# Patient Record
Sex: Female | Born: 1990 | Race: White | Hispanic: No | Marital: Single | Smoking: Current every day smoker
Health system: Southern US, Community
[De-identification: ages and names within clinical notes are randomized; demographics above are authoritative.]

## PROBLEM LIST (undated history)

## (undated) DIAGNOSIS — F419 Anxiety disorder, unspecified: Secondary | ICD-10-CM

## (undated) DIAGNOSIS — F32A Depression, unspecified: Secondary | ICD-10-CM

## (undated) DIAGNOSIS — F329 Major depressive disorder, single episode, unspecified: Secondary | ICD-10-CM

## (undated) HISTORY — PX: ANTERIOR CRUCIATE LIGAMENT REPAIR: SHX115

---
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
## 2009-05-09 ENCOUNTER — Ambulatory Visit: Payer: Self-pay | Admitting: Family Medicine

## 2009-05-09 ENCOUNTER — Inpatient Hospital Stay (HOSPITAL_COMMUNITY): Admission: RE | Admit: 2009-05-09 | Discharge: 2009-05-12 | Payer: Self-pay | Admitting: Obstetrics and Gynecology

## 2010-10-09 LAB — CBC
Hemoglobin: 13.3 g/dL (ref 12.0–15.0)
MCHC: 34 g/dL (ref 30.0–36.0)
MCHC: 34.4 g/dL (ref 30.0–36.0)
MCV: 94.8 fL (ref 78.0–100.0)
Platelets: 239 10*3/uL (ref 150–400)
Platelets: 255 10*3/uL (ref 150–400)
RBC: 4.01 MIL/uL (ref 3.87–5.11)
RBC: 4.13 MIL/uL (ref 3.87–5.11)
RDW: 13.1 % (ref 11.5–15.5)
WBC: 19.1 10*3/uL — ABNORMAL HIGH (ref 4.0–10.5)

## 2010-10-09 LAB — RPR: RPR Ser Ql: NONREACTIVE

## 2011-05-19 ENCOUNTER — Other Ambulatory Visit (HOSPITAL_COMMUNITY)
Admission: RE | Admit: 2011-05-19 | Discharge: 2011-05-19 | Disposition: A | Discharge status: Home / Self Care | Payer: Medicaid Other | Source: Ambulatory Visit | Attending: Obstetrics and Gynecology | Admitting: Obstetrics and Gynecology

## 2011-05-19 DIAGNOSIS — Z01419 Encounter for gynecological examination (general) (routine) without abnormal findings: Secondary | ICD-10-CM | POA: Insufficient documentation

## 2011-05-19 DIAGNOSIS — Z113 Encounter for screening for infections with a predominantly sexual mode of transmission: Secondary | ICD-10-CM | POA: Insufficient documentation

## 2011-05-19 LAB — OB RESULTS CONSOLE RPR: RPR: NONREACTIVE

## 2011-05-19 LAB — OB RESULTS CONSOLE GC/CHLAMYDIA: Chlamydia: POSITIVE

## 2011-05-19 LAB — OB RESULTS CONSOLE HEPATITIS B SURFACE ANTIGEN: Hepatitis B Surface Ag: NEGATIVE

## 2011-05-19 LAB — OB RESULTS CONSOLE HIV ANTIBODY (ROUTINE TESTING): HIV: NONREACTIVE

## 2011-05-19 LAB — OB RESULTS CONSOLE ABO/RH: RH Type: POSITIVE

## 2011-07-08 NOTE — L&D Delivery Note (Signed)
Delivery Note At 3:07 AM a viable and healthy female was delivered via Vaginal, Spontaneous Delivery (Presentation: ; Occiput Anterior).  APGAR: , ; weight .   Placenta status: Intact, Spontaneous.  Cord: 3 vessels with the following complications: None.  Cord pH: n/a  Anesthesia: Epidural  Episiotomy:  Lacerations:  Suture Repair: n/a Est. Blood Loss (mL): 300  Mom to postpartum.  Baby to nursery-stable.  [facility] 11/09/2011, 3:19 AM

## 2011-10-26 ENCOUNTER — Encounter (HOSPITAL_COMMUNITY): Payer: Self-pay | Admitting: *Deleted

## 2011-10-26 ENCOUNTER — Inpatient Hospital Stay (HOSPITAL_COMMUNITY)
Admission: AD | Admit: 2011-10-26 | Discharge: 2011-10-26 | Disposition: A | Discharge status: Home / Self Care | Payer: Medicaid Other | Source: Ambulatory Visit | Attending: Obstetrics & Gynecology | Admitting: Obstetrics & Gynecology

## 2011-10-26 DIAGNOSIS — O99891 Other specified diseases and conditions complicating pregnancy: Secondary | ICD-10-CM | POA: Insufficient documentation

## 2011-10-26 DIAGNOSIS — O479 False labor, unspecified: Secondary | ICD-10-CM

## 2011-10-26 DIAGNOSIS — O471 False labor at or after 37 completed weeks of gestation: Secondary | ICD-10-CM

## 2011-10-26 LAB — WET PREP, GENITAL
Trich, Wet Prep: NONE SEEN
Yeast Wet Prep HPF POC: NONE SEEN

## 2011-10-26 LAB — AMNISURE RUPTURE OF MEMBRANE (ROM) NOT AT ARMC: Amnisure ROM: NEGATIVE

## 2011-10-26 NOTE — MAU Note (Signed)
Pt reports leaking clear fluid since 4pm and having ct 7-10 min apart. Reports fetal movement less than usual. Denies vaginal bleeding

## 2011-10-26 NOTE — MAU Provider Note (Signed)
  History     CSN: [id number]  Arrival date and time: 10/26/11 1847   None     Chief Complaint  Patient presents with  . Rupture of Membranes   HPI G2P1001 @ 37 4 in with c/o leaking fluid and contractions.  OB History    Grav Para Term Preterm Abortions TAB SAB Ect Mult Living   2 1 1       1       Past Medical History  Diagnosis Date  . No pertinent past medical history     Past Surgical History  Procedure Date  . Anterior cruciate ligament repair     History reviewed. No pertinent family history.  History  Substance Use Topics  . Smoking status: Current Everyday Smoker -- 0.5 packs/day    Types: Cigarettes  . Smokeless tobacco: Not on file  . Alcohol Use: No    Allergies: Allergies not on file  No prescriptions prior to admission    Review of Systems  Constitutional: Negative.   HENT: Negative.   Eyes: Negative.   Respiratory: Negative.   Cardiovascular: Negative.   Gastrointestinal: Positive for abdominal pain.  Genitourinary: Negative.   Musculoskeletal: Negative.   Skin: Negative.   Neurological: Negative.   Endo/Heme/Allergies: Negative.   Psychiatric/Behavioral: Negative.    Physical Exam   Blood pressure 113/70, pulse 105, temperature 97.9 F (36.6 C), temperature source Oral, resp. rate 18, height 5\' 4"  (1.626 m), weight 135 lb (61.236 kg).  Physical Exam  Constitutional: She is oriented to person, place, and time. She appears well-developed.  HENT:  Head: Normocephalic.  Neck: Normal range of motion.  Cardiovascular: Normal rate, regular rhythm, normal heart sounds and intact distal pulses.   Respiratory: Effort normal and breath sounds normal.  GI: Soft. Bowel sounds are normal.  Genitourinary: Vagina normal and uterus normal.  Musculoskeletal: Normal range of motion.  Neurological: She is alert and oriented to person, place, and time. She has normal reflexes.  Skin: Skin is warm and dry.  Psychiatric: She has a normal mood and  affect. Her behavior is normal. Judgment and thought content normal.    MAU Course  Procedures  MDM   Assessment and Plan  Sterile spec exam, no active leaking on exam or with strong cough, frothy green discharge noted wet prep obtained. If neg amnisure will d/c home, only having mild irreg contractions.  Pamela Glover 10/26/2011, 7:34 PM

## 2011-10-26 NOTE — Discharge Instructions (Signed)

## 2011-11-08 ENCOUNTER — Encounter (HOSPITAL_COMMUNITY): Payer: Self-pay | Admitting: *Deleted

## 2011-11-08 ENCOUNTER — Inpatient Hospital Stay (HOSPITAL_COMMUNITY)
Admission: AD | Admit: 2011-11-08 | Discharge: 2011-11-10 | DRG: 775 | Disposition: A | Discharge status: Home / Self Care | Payer: Medicaid Other | Source: Ambulatory Visit | Attending: Family Medicine | Admitting: Family Medicine

## 2011-11-08 DIAGNOSIS — IMO0001 Reserved for inherently not codable concepts without codable children: Secondary | ICD-10-CM

## 2011-11-08 HISTORY — DX: Depression, unspecified: F32.A

## 2011-11-08 HISTORY — DX: Major depressive disorder, single episode, unspecified: F32.9

## 2011-11-08 LAB — CBC
HCT: 40.1 % (ref 36.0–46.0)
Hemoglobin: 13.8 g/dL (ref 12.0–15.0)
MCH: 31.6 pg (ref 26.0–34.0)
MCHC: 34.4 g/dL (ref 30.0–36.0)
MCV: 91.8 fL (ref 78.0–100.0)
RBC: 4.37 MIL/uL (ref 3.87–5.11)

## 2011-11-08 MED ORDER — LACTATED RINGERS IV SOLN
500.0000 mL | Freq: Once | INTRAVENOUS | Status: AC
  Administered 2011-11-08: 500 mL via INTRAVENOUS

## 2011-11-08 MED ORDER — ONDANSETRON HCL 4 MG/2ML IJ SOLN: 4.0000 mg | Freq: Four times a day (QID) | INTRAMUSCULAR | Status: DC | PRN

## 2011-11-08 MED ORDER — CITRIC ACID-SODIUM CITRATE 334-500 MG/5ML PO SOLN: 30.0000 mL | ORAL | Status: DC | PRN

## 2011-11-08 MED ORDER — PHENYLEPHRINE 40 MCG/ML (10ML) SYRINGE FOR IV PUSH (FOR BLOOD PRESSURE SUPPORT)
80.0000 ug | PREFILLED_SYRINGE | INTRAVENOUS | Status: DC | PRN
  Filled 2011-11-08: qty 5

## 2011-11-08 MED ORDER — IBUPROFEN 600 MG PO TABS
600.0000 mg | ORAL_TABLET | Freq: Four times a day (QID) | ORAL | Status: DC | PRN
  Administered 2011-11-09: 600 mg via ORAL
  Filled 2011-11-08: qty 1

## 2011-11-08 MED ORDER — FLEET ENEMA 7-19 GM/118ML RE ENEM: 1.0000 | ENEMA | RECTAL | Status: DC | PRN

## 2011-11-08 MED ORDER — PHENYLEPHRINE 40 MCG/ML (10ML) SYRINGE FOR IV PUSH (FOR BLOOD PRESSURE SUPPORT): 80.0000 ug | PREFILLED_SYRINGE | INTRAVENOUS | Status: DC | PRN

## 2011-11-08 MED ORDER — LACTATED RINGERS IV SOLN: 500.0000 mL | INTRAVENOUS | Status: DC | PRN

## 2011-11-08 MED ORDER — EPHEDRINE 5 MG/ML INJ
10.0000 mg | INTRAVENOUS | Status: DC | PRN
  Filled 2011-11-08: qty 4

## 2011-11-08 MED ORDER — DIPHENHYDRAMINE HCL 50 MG/ML IJ SOLN: 12.5000 mg | INTRAMUSCULAR | Status: DC | PRN

## 2011-11-08 MED ORDER — FENTANYL 2.5 MCG/ML BUPIVACAINE 1/10 % EPIDURAL INFUSION (WH - ANES)
14.0000 mL/h | INTRAMUSCULAR | Status: DC
  Filled 2011-11-08: qty 60

## 2011-11-08 MED ORDER — EPHEDRINE 5 MG/ML INJ: 10.0000 mg | INTRAVENOUS | Status: DC | PRN

## 2011-11-08 MED ORDER — OXYCODONE-ACETAMINOPHEN 5-325 MG PO TABS: 1.0000 | ORAL_TABLET | ORAL | Status: DC | PRN

## 2011-11-08 MED ORDER — LIDOCAINE HCL (PF) 1 % IJ SOLN
30.0000 mL | INTRAMUSCULAR | Status: DC | PRN
Start: 2011-11-08 — End: 2011-11-09
  Filled 2011-11-08: qty 30

## 2011-11-08 MED ORDER — OXYTOCIN 20 UNITS IN LACTATED RINGERS INFUSION - SIMPLE
125.0000 mL/h | Freq: Once | INTRAVENOUS | Status: AC
  Administered 2011-11-09: 500 mL/h via INTRAVENOUS

## 2011-11-08 MED ORDER — OXYTOCIN BOLUS FROM INFUSION
500.0000 mL | Freq: Once | INTRAVENOUS | Status: DC
  Filled 2011-11-08: qty 1000
  Filled 2011-11-08: qty 500

## 2011-11-08 MED ORDER — ACETAMINOPHEN 325 MG PO TABS: 650.0000 mg | ORAL_TABLET | ORAL | Status: DC | PRN

## 2011-11-08 MED ORDER — LACTATED RINGERS IV SOLN
INTRAVENOUS | Status: DC
  Administered 2011-11-08: 23:00:00 via INTRAVENOUS

## 2011-11-08 NOTE — H&P (Signed)
Pamela Glover is a 21 y.o. female presenting for contractions. Maternal Medical History:  Reason for admission: Reason for admission: contractions.  Contractions: Onset was 1-2 hours ago.   Frequency: regular.   Perceived severity is strong.    Fetal activity: Perceived fetal activity is normal.   Last perceived fetal movement was within the past hour.    Prenatal complications: No bleeding.   Polyhydramnios:  contractions.       OB History    Grav Para Term Preterm Abortions TAB SAB Ect Mult Living   2 1 1       1      Past Medical History  Diagnosis Date  . Depression    Past Surgical History  Procedure Date  . Anterior cruciate ligament repair    Family History: family history is not on file. Social History:  reports that she has been smoking Cigarettes.  She has been smoking about .5 packs per day. She does not have any smokeless tobacco history on file. She reports that she does not drink alcohol or use illicit drugs.  Review of Systems  Gastrointestinal: Positive for abdominal pain.  All other systems reviewed and are negative.    Dilation: 5 Effacement (%): 90 Station: -1 Exam by:: Peace, rn Height 5\' 4"  (1.626 m), weight 61.349 kg (135 lb 4 oz). Maternal Exam:  Uterine Assessment: Contraction strength is firm.  Abdomen: Estimated fetal weight is 5-5.5.   Fetal presentation: vertex  Introitus: Vagina is positive for vaginal discharge (mucusy).    Fetal Exam Fetal Monitor Review: Baseline rate: 130's.  Variability: moderate (6-25 bpm).   Pattern: accelerations present.    Fetal State Assessment: Category I - tracings are normal.     Physical Exam  Constitutional: She is oriented to person, place, and time. She appears well-developed and well-nourished.  HENT:  Head: Normocephalic.  Neck: Normal range of motion. Neck supple.  Cardiovascular: Normal rate, regular rhythm and normal heart sounds.   Respiratory: Effort normal and breath sounds normal.    GI: Soft. There is no tenderness.  Genitourinary: No bleeding around the vagina. Vaginal discharge (mucusy) found.  Musculoskeletal: Normal range of motion.  Neurological: She is alert and oriented to person, place, and time.  Skin: Skin is warm and dry.    Prenatal labs: ABO, Rh: O/Positive/-- (11/12 0000) Antibody: Negative (11/12 0000) Rubella: Immune (11/12 0000) RPR: Nonreactive (11/12 0000)  HBsAg: Negative (11/12 0000)  HIV: Non-reactive (11/12 0000)  GBS: Negative (04/16 0000)   Assessment/Plan: Active Labor GBS neg  Plan: Admit to Birthing Suites May have epidural Anticipate NSVD   [facility] 11/08/2011, 11:48 PM

## 2011-11-09 ENCOUNTER — Encounter (HOSPITAL_COMMUNITY): Payer: Self-pay | Admitting: Anesthesiology

## 2011-11-09 ENCOUNTER — Encounter (HOSPITAL_COMMUNITY): Payer: Self-pay | Admitting: *Deleted

## 2011-11-09 ENCOUNTER — Inpatient Hospital Stay (HOSPITAL_COMMUNITY): Payer: Medicaid Other | Admitting: Anesthesiology

## 2011-11-09 LAB — ABO/RH: ABO/RH(D): O POS

## 2011-11-09 MED ORDER — ONDANSETRON HCL 4 MG/2ML IJ SOLN: 4.0000 mg | INTRAMUSCULAR | Status: DC | PRN

## 2011-11-09 MED ORDER — ZOLPIDEM TARTRATE 5 MG PO TABS: 5.0000 mg | ORAL_TABLET | Freq: Every evening | ORAL | Status: DC | PRN

## 2011-11-09 MED ORDER — SENNOSIDES-DOCUSATE SODIUM 8.6-50 MG PO TABS
2.0000 | ORAL_TABLET | Freq: Every day | ORAL | Status: DC
  Administered 2011-11-09: 2 via ORAL

## 2011-11-09 MED ORDER — PRENATAL MULTIVITAMIN CH
1.0000 | ORAL_TABLET | Freq: Every day | ORAL | Status: DC
  Administered 2011-11-09 – 2011-11-10 (×2): 1 via ORAL
  Filled 2011-11-09 (×2): qty 1

## 2011-11-09 MED ORDER — LIDOCAINE HCL (PF) 1 % IJ SOLN
INTRAMUSCULAR | Status: DC | PRN
  Administered 2011-11-09 (×2): 8 mL

## 2011-11-09 MED ORDER — WITCH HAZEL-GLYCERIN EX PADS: 1.0000 "application " | MEDICATED_PAD | CUTANEOUS | Status: DC | PRN

## 2011-11-09 MED ORDER — BENZOCAINE-MENTHOL 20-0.5 % EX AERO
1.0000 "application " | INHALATION_SPRAY | CUTANEOUS | Status: DC | PRN
  Filled 2011-11-09: qty 56

## 2011-11-09 MED ORDER — FENTANYL 2.5 MCG/ML BUPIVACAINE 1/10 % EPIDURAL INFUSION (WH - ANES)
INTRAMUSCULAR | Status: DC | PRN
  Administered 2011-11-09: 14 mL/h via EPIDURAL

## 2011-11-09 MED ORDER — DIBUCAINE 1 % RE OINT: 1.0000 "application " | TOPICAL_OINTMENT | RECTAL | Status: DC | PRN

## 2011-11-09 MED ORDER — SIMETHICONE 80 MG PO CHEW: 80.0000 mg | CHEWABLE_TABLET | ORAL | Status: DC | PRN

## 2011-11-09 MED ORDER — OXYCODONE-ACETAMINOPHEN 5-325 MG PO TABS
1.0000 | ORAL_TABLET | ORAL | Status: DC | PRN
  Administered 2011-11-09: 2 via ORAL
  Administered 2011-11-09 (×2): 1 via ORAL
  Administered 2011-11-09 – 2011-11-10 (×3): 2 via ORAL
  Filled 2011-11-09 (×3): qty 2
  Filled 2011-11-09: qty 1
  Filled 2011-11-09: qty 2
  Filled 2011-11-09: qty 1

## 2011-11-09 MED ORDER — DIPHENHYDRAMINE HCL 25 MG PO CAPS: 25.0000 mg | ORAL_CAPSULE | Freq: Four times a day (QID) | ORAL | Status: DC | PRN

## 2011-11-09 MED ORDER — ONDANSETRON HCL 4 MG PO TABS: 4.0000 mg | ORAL_TABLET | ORAL | Status: DC | PRN

## 2011-11-09 MED ORDER — LANOLIN HYDROUS EX OINT: TOPICAL_OINTMENT | CUTANEOUS | Status: DC | PRN

## 2011-11-09 MED ORDER — IBUPROFEN 600 MG PO TABS
600.0000 mg | ORAL_TABLET | Freq: Four times a day (QID) | ORAL | Status: DC
  Administered 2011-11-09 – 2011-11-10 (×5): 600 mg via ORAL
  Filled 2011-11-09 (×5): qty 1

## 2011-11-09 MED ORDER — TETANUS-DIPHTH-ACELL PERTUSSIS 5-2.5-18.5 LF-MCG/0.5 IM SUSP
0.5000 mL | Freq: Once | INTRAMUSCULAR | Status: AC
  Administered 2011-11-10: 0.5 mL via INTRAMUSCULAR
  Filled 2011-11-09: qty 0.5

## 2011-11-09 NOTE — Anesthesia Procedure Notes (Signed)
Epidural Patient location during procedure: OB Start time: 11/09/2011 12:13 AM End time: 11/09/2011 12:17 AM Reason for block: procedure for pain  Staffing Anesthesiologist: Sandrea Hughs  Preanesthetic Checklist Completed: patient identified, site marked, surgical consent, pre-op evaluation, timeout performed, IV checked, risks and benefits discussed and monitors and equipment checked  Epidural Patient position: sitting Prep: site prepped and draped and DuraPrep Patient monitoring: continuous pulse ox and blood pressure Approach: midline Injection technique: LOR air  Needle:  Needle type: Tuohy  Needle gauge: 17 G Needle length: 9 cm Catheter type: closed end flexible Catheter size: 19 Gauge Catheter at skin depth: 8 cm Test dose: negative  Assessment Sensory level: T8 Events: blood not aspirated, injection not painful, no injection resistance, negative IV test and no paresthesia

## 2011-11-09 NOTE — Progress Notes (Addendum)
Patient and significant other given fall prevention instructions and to call so stedy could be used to assist patient to bathroom. Fall prevention instructions also signed by patient. Patient had significant other assist her to bathroom without calling. Patient stated "I was able to walk in bathroom without a problem and I did not fall".  Patient again instructed to call for assist.

## 2011-11-09 NOTE — Progress Notes (Signed)
Pt shaking, RN reassessing 

## 2011-11-09 NOTE — H&P (Signed)
Chart reviewed and agree with management and plan.  

## 2011-11-09 NOTE — Progress Notes (Signed)
   Subjective: Pt reports comfortable after epidural.    Objective: BP 98/67  Pulse 102  Temp(Src) 97.8 F (36.6 C) (Oral)  Resp 18  Ht 5\' 4"  (1.626 m)  Wt 61.349 kg (135 lb 4 oz)  BMI 23.22 kg/m2  SpO2 99%      FHT:  FHR: 140's bpm, variability: moderate,  accelerations:  Present,  decelerations:  Absent UC:   regular, every 2-3 minutes SVE:   Dilation: 6 Effacement (%): 80 Station: -1 Exam by:: Roney Marion, CNM  AROM>clear  Labs: Lab Results  Component Value Date   WBC 18.9* 11/08/2011   HGB 13.8 11/08/2011   HCT 40.1 11/08/2011   MCV 91.8 11/08/2011   PLT 229 11/08/2011    Assessment / Plan: Spontaneous labor, progressing normally  Labor: Progressing normally Preeclampsia:  n/a Fetal Wellbeing:  Category I Pain Control:  Epidural I/D:  n/a Anticipated MOD:  NSVD  Pamela Glover,Pamela Glover 11/09/2011, 1:58 AM

## 2011-11-09 NOTE — Anesthesia Preprocedure Evaluation (Signed)
Anesthesia Evaluation  Patient identified by MRN, date of birth, ID band Patient awake    Reviewed: Allergy & Precautions, H&P , NPO status , Patient's Chart, lab work & pertinent test results  Airway Mallampati: I TM Distance: >3 FB Neck ROM: full    Dental No notable dental hx.    Pulmonary neg pulmonary ROS,  breath sounds clear to auscultation  Pulmonary exam normal       Cardiovascular negative cardio ROS      Neuro/Psych PSYCHIATRIC DISORDERS Depression negative neurological ROS     GI/Hepatic negative GI ROS, Neg liver ROS,   Endo/Other  negative endocrine ROS  Renal/GU negative Renal ROS  negative genitourinary   Musculoskeletal negative musculoskeletal ROS (+)   Abdominal Normal abdominal exam  (+)   Peds negative pediatric ROS (+)  Hematology negative hematology ROS (+)   Anesthesia Other Findings   Reproductive/Obstetrics (+) Pregnancy                           Anesthesia Physical Anesthesia Plan  ASA: II  Anesthesia Plan: Epidural   Post-op Pain Management:    Induction:   Airway Management Planned:   Additional Equipment:   Intra-op Plan:   Post-operative Plan:   Informed Consent: I have reviewed the patients History and Physical, chart, labs and discussed the procedure including the risks, benefits and alternatives for the proposed anesthesia with the patient or authorized representative who has indicated his/her understanding and acceptance.     Plan Discussed with:   Anesthesia Plan Comments:         Anesthesia Quick Evaluation

## 2011-11-10 MED ORDER — IBUPROFEN 600 MG PO TABS: 600.0000 mg | ORAL_TABLET | Freq: Four times a day (QID) | ORAL | Status: AC

## 2011-11-10 NOTE — Progress Notes (Signed)
UR chart review completed.  

## 2011-11-10 NOTE — Anesthesia Postprocedure Evaluation (Signed)
Anesthesia Post Note  Patient: Pamela Glover  Procedure(s) Performed: * No procedures listed *  Anesthesia type: Epidural  Patient location: Mother/Baby  Post pain: Pain level controlled  Post assessment: Post-op Vital signs reviewed  Last Vitals: There were no vitals filed for this visit.  Post vital signs: Reviewed  Level of consciousness: awake  Complications: No apparent anesthesia complications

## 2011-11-10 NOTE — Discharge Summary (Signed)
Obstetric Discharge Summary Reason for Admission: onset of labor Prenatal Procedures: none Intrapartum Procedures: spontaneous vaginal delivery Postpartum Procedures: none Complications-Operative and Postpartum: none Hemoglobin  Date Value Range Status  11/08/2011 13.8  12.0-15.0 (g/dL) Final     HCT  Date Value Range Status  11/08/2011 40.1  36.0-46.0 (%) Final    Physical Exam:  General: alert, cooperative and no distress Lochia: appropriate Uterine Fundus: firm DVT Evaluation: No evidence of DVT seen on physical exam. Negative Homan's sign.  Discharge Diagnoses: Term Pregnancy-delivered  Discharge Information: Date: 11/10/2011 Activity: pelvic rest Diet: routine Medications: PNV and Ibuprofen Condition: stable Instructions: refer to practice specific booklet Discharge to: home Follow-up Information    Follow up with [facility]. Schedule an appointment as soon as possible for a visit in 6 weeks.         Newborn Data: Live born female  Birth Weight: 6 lb 6.1 oz (2895 g) APGAR: 8, 9  Home with mother.  Corrigan Kretschmer JEHIEL 11/10/2011, 9:13 AM

## 2011-11-10 NOTE — Discharge Instructions (Signed)
Vaginal Delivery Care After  Change your pad on each trip to the bathroom.   Wipe gently with toilet paper during your hospital stay. Always wipe from front to back. A spray bottle with warm tap water could also be used or a towelette if available.   Place your soiled pad and toilet paper in a bathroom wastebasket with a plastic bag liner.   During your hospital stay, save any clots. If you pass a clot while on the toilet, do not flush it. Also, if your vaginal flow seems excessive to you, notify nursing personnel.   The first time you get out of bed after delivery, wait for assistance from a nurse. Do not get up alone at any time if you feel weak or dizzy.   Bend and extend your ankles forcefully so that you feel the calves of your legs get hard. Do this 6 times every hour when you are in bed and awake.   Do not sit with one foot under you, dangle your legs over the edge of the bed, or maintain a position that hinders the circulation in your legs.   Many women experience after pains for 2 to 3 days after delivery. These after pains are mild uterine contractions. Ask the nurse for a pain medication if you need something for this. Sometimes breastfeeding stimulates after pains; if you find this to be true, ask for the medication  -  hour before the next feeding.   For you and your infant's protection, do not go beyond the door(s) of the obstetric unit. Do not carry your baby in your arms in the hallway. When taking your baby to and from your room, put your baby in the bassinet and push the bassinet.   Mothers may have their babies in their room as much as they desire.  Document Released: 06/20/2000 Document Revised: 06/12/2011 Document Reviewed: 05/21/2007 [employer] Patient Information 2012 [employer]. 

## 2012-12-02 ENCOUNTER — Emergency Department (HOSPITAL_COMMUNITY)
Admission: EM | Admit: 2012-12-02 | Discharge: 2012-12-02 | Disposition: A | Discharge status: Home / Self Care | Payer: Medicaid Other | Attending: Emergency Medicine | Admitting: Emergency Medicine

## 2012-12-02 ENCOUNTER — Encounter (HOSPITAL_COMMUNITY): Payer: Self-pay | Admitting: Emergency Medicine

## 2012-12-02 DIAGNOSIS — R059 Cough, unspecified: Secondary | ICD-10-CM | POA: Insufficient documentation

## 2012-12-02 DIAGNOSIS — J029 Acute pharyngitis, unspecified: Secondary | ICD-10-CM | POA: Insufficient documentation

## 2012-12-02 DIAGNOSIS — R05 Cough: Secondary | ICD-10-CM | POA: Insufficient documentation

## 2012-12-02 DIAGNOSIS — Z8659 Personal history of other mental and behavioral disorders: Secondary | ICD-10-CM | POA: Insufficient documentation

## 2012-12-02 DIAGNOSIS — R509 Fever, unspecified: Secondary | ICD-10-CM | POA: Insufficient documentation

## 2012-12-02 DIAGNOSIS — F17200 Nicotine dependence, unspecified, uncomplicated: Secondary | ICD-10-CM | POA: Insufficient documentation

## 2012-12-02 LAB — RAPID STREP SCREEN (MED CTR MEBANE ONLY): Streptococcus, Group A Screen (Direct): NEGATIVE

## 2012-12-02 MED ORDER — AMOXICILLIN 500 MG PO CAPS: 500.0000 mg | ORAL_CAPSULE | Freq: Three times a day (TID) | ORAL | Status: DC

## 2012-12-02 NOTE — ED Notes (Signed)
Pt c/o sore throat and tonsils swollen x 5 days. States thinks she had a fever Friday with cold chills. Nad.

## 2012-12-02 NOTE — ED Provider Notes (Signed)
History    This chart was scribed for Benny Lennert, MD by Marlyne Beards, ED Scribe. The patient was seen in room APA15/APA15. Patient's care was started at 6:35 PM.    CSN: [id number]  Arrival date & time 12/02/12  1733   First MD Initiated Contact with Patient 12/02/12 1835      Chief Complaint  Patient presents with  . Sore Throat    (Consider location/radiation/quality/duration/timing/severity/associated sxs/prior treatment) Patient is a 22 y.o. female presenting with pharyngitis. The history is provided by the patient and a friend. No language interpreter was used.  Sore Throat Pertinent negatives include no chest pain, no abdominal pain and no headaches.   HPI Comments: Pamela Glover is a 22 y.o. female with h/o depression who presents to the Emergency Department complaining of moderate constant sore throat with an associated cough and swollen tonsils (progressively getting worse) for the past 5 days. Pt states that she thinks she had a fever last Friday due to associated cold chills. Pt states that she woke up last night because she could not breath due to her swollen tonsils. Boyfriend reports that she has been snoring more persistently than usual. Pt states that she has been staying hydrated. Pt denies nausea, vomiting, diarrhea, and any other associated symptoms.    Past Medical History  Diagnosis Date  . Depression     Past Surgical History  Procedure Laterality Date  . Anterior cruciate ligament repair      History reviewed. No pertinent family history.  History  Substance Use Topics  . Smoking status: Current Every Day Smoker -- 0.50 packs/day    Types: Cigarettes  . Smokeless tobacco: Not on file  . Alcohol Use: No    OB History   Grav Para Term Preterm Abortions TAB SAB Ect Mult Living   2 2 2       2       Review of Systems  Constitutional: Positive for fever and chills. Negative for appetite change and fatigue.  HENT: Positive for sore throat.  Negative for congestion, sinus pressure and ear discharge.   Eyes: Negative for discharge.  Respiratory: Positive for cough.   Cardiovascular: Negative for chest pain.  Gastrointestinal: Negative for abdominal pain and diarrhea.  Genitourinary: Negative for frequency and hematuria.  Musculoskeletal: Negative for back pain.  Skin: Negative for rash.  Neurological: Negative for seizures and headaches.  Psychiatric/Behavioral: Negative for hallucinations.    Allergies  Review of patient's allergies indicates no known allergies.  Home Medications  No current outpatient prescriptions on file.  BP 105/69  Pulse 88  Temp(Src) 99.1 F (37.3 C) (Oral)  SpO2 100%  LMP 12/02/2012  Physical Exam  Nursing note and vitals reviewed. Constitutional: She is oriented to person, place, and time. She appears well-developed.  HENT:  Head: Normocephalic.  Pharynx is mildly enflamed  Eyes: Conjunctivae and EOM are normal. No scleral icterus.  Neck: Neck supple. No thyromegaly present.  Cardiovascular: Normal rate and regular rhythm.  Exam reveals no gallop and no friction rub.   No murmur heard. Pulmonary/Chest: No stridor. She has no wheezes. She has no rales. She exhibits no tenderness.  Abdominal: She exhibits no distension. There is no tenderness. There is no rebound.  Musculoskeletal: Normal range of motion. She exhibits no edema.  Lymphadenopathy:    She has cervical adenopathy.  Neurological: She is oriented to person, place, and time. Coordination normal.  Skin: No rash noted. No erythema.  Psychiatric: She has a normal  mood and affect. Her behavior is normal.    ED Course  Procedures (including critical care time) DIAGNOSTIC STUDIES: Oxygen Saturation is 100% on room air, normal by my interpretation.    COORDINATION OF CARE: 6:40 PM Discussed ED treatment with pt and pt agrees.  6:49 PM strep test results were negative.    Labs Reviewed  RAPID STREP SCREEN  CULTURE, GROUP A  STREP   No results found.   No diagnosis found.    MDM       The chart was scribed for me under my direct supervision.  I personally performed the history, physical, and medical decision making and all procedures in the evaluation of this patient.Benny Lennert, MD 12/04/12 [phone]

## 2012-12-02 NOTE — ED Notes (Signed)
nad noted prior to dc. Dc instructions reviewed and explained. 1 script given to pt. Ambulated out without difficulty.  

## 2012-12-04 LAB — CULTURE, GROUP A STREP

## 2014-05-08 ENCOUNTER — Encounter (HOSPITAL_COMMUNITY): Payer: Self-pay | Admitting: Emergency Medicine

## 2014-07-07 HISTORY — PX: NOSE SURGERY: SHX723

## 2014-11-24 ENCOUNTER — Emergency Department (HOSPITAL_COMMUNITY): Payer: Medicaid Other

## 2014-11-24 ENCOUNTER — Emergency Department (HOSPITAL_COMMUNITY)
Admission: EM | Admit: 2014-11-24 | Discharge: 2014-11-24 | Disposition: A | Discharge status: Home / Self Care | Payer: Medicaid Other | Attending: Emergency Medicine | Admitting: Emergency Medicine

## 2014-11-24 ENCOUNTER — Encounter (HOSPITAL_COMMUNITY): Payer: Self-pay | Admitting: Emergency Medicine

## 2014-11-24 DIAGNOSIS — Z720 Tobacco use: Secondary | ICD-10-CM | POA: Insufficient documentation

## 2014-11-24 DIAGNOSIS — Z792 Long term (current) use of antibiotics: Secondary | ICD-10-CM | POA: Insufficient documentation

## 2014-11-24 DIAGNOSIS — S8991XA Unspecified injury of right lower leg, initial encounter: Secondary | ICD-10-CM | POA: Insufficient documentation

## 2014-11-24 DIAGNOSIS — Y9301 Activity, walking, marching and hiking: Secondary | ICD-10-CM | POA: Insufficient documentation

## 2014-11-24 DIAGNOSIS — M25561 Pain in right knee: Secondary | ICD-10-CM

## 2014-11-24 DIAGNOSIS — F329 Major depressive disorder, single episode, unspecified: Secondary | ICD-10-CM | POA: Diagnosis not present

## 2014-11-24 DIAGNOSIS — Y9289 Other specified places as the place of occurrence of the external cause: Secondary | ICD-10-CM | POA: Insufficient documentation

## 2014-11-24 DIAGNOSIS — F419 Anxiety disorder, unspecified: Secondary | ICD-10-CM | POA: Insufficient documentation

## 2014-11-24 DIAGNOSIS — Y998 Other external cause status: Secondary | ICD-10-CM | POA: Diagnosis not present

## 2014-11-24 DIAGNOSIS — X58XXXA Exposure to other specified factors, initial encounter: Secondary | ICD-10-CM | POA: Diagnosis not present

## 2014-11-24 HISTORY — DX: Anxiety disorder, unspecified: F41.9

## 2014-11-24 MED ORDER — DEXAMETHASONE 4 MG PO TABS: 4.0000 mg | ORAL_TABLET | Freq: Two times a day (BID) | ORAL | Status: DC

## 2014-11-24 MED ORDER — KETOROLAC TROMETHAMINE 10 MG PO TABS
10.0000 mg | ORAL_TABLET | Freq: Once | ORAL | Status: AC
  Administered 2014-11-24: 10 mg via ORAL
  Filled 2014-11-24: qty 1

## 2014-11-24 MED ORDER — MELOXICAM 15 MG PO TABS: 15.0000 mg | ORAL_TABLET | Freq: Every day | ORAL | Status: DC

## 2014-11-24 MED ORDER — ACETAMINOPHEN 500 MG PO TABS
500.0000 mg | ORAL_TABLET | Freq: Once | ORAL | Status: AC
  Administered 2014-11-24: 500 mg via ORAL
  Filled 2014-11-24: qty 1

## 2014-11-24 NOTE — Discharge Instructions (Signed)
Your knee x-ray is negative for fracture or dislocation or effusion. Please see Dr. Hilda LiasKeeling, or the orthopedist of your choice for orthopedic evaluation concerning your ongoing knee pain. Please use the knee immobilizer when you're up and about. Use the crutches until you can safely apply weight to your lower extremity. Knee Pain The knee is the complex joint between your thigh and your lower leg. It is made up of bones, tendons, ligaments, and cartilage. The bones that make up the knee are:  The femur in the thigh.  The tibia and fibula in the lower leg.  The patella or kneecap riding in the groove on the lower femur. CAUSES  Knee pain is a common complaint with many causes. A few of these causes are:  Injury, such as:  A ruptured ligament or tendon injury.  Torn cartilage.  Medical conditions, such as:  Gout  Arthritis  Infections  Overuse, over training, or overdoing a physical activity. Knee pain can be minor or severe. Knee pain can accompany debilitating injury. Minor knee problems often respond well to self-care measures or get well on their own. More serious injuries may need medical intervention or even surgery. SYMPTOMS The knee is complex. Symptoms of knee problems can vary widely. Some of the problems are:  Pain with movement and weight bearing.  Swelling and tenderness.  Buckling of the knee.  Inability to straighten or extend your knee.  Your knee locks and you cannot straighten it.  Warmth and redness with pain and fever.  Deformity or dislocation of the kneecap. DIAGNOSIS  Determining what is wrong may be very straight forward such as when there is an injury. It can also be challenging because of the complexity of the knee. Tests to make a diagnosis may include:  Your caregiver taking a history and doing a physical exam.  Routine X-rays can be used to rule out other problems. X-rays will not reveal a cartilage tear. Some injuries of the knee can be  diagnosed by:  Arthroscopy a surgical technique by which a small video camera is inserted through tiny incisions on the sides of the knee. This procedure is used to examine and repair internal knee joint problems. Tiny instruments can be used during arthroscopy to repair the torn knee cartilage (meniscus).  Arthrography is a radiology technique. A contrast liquid is directly injected into the knee joint. Internal structures of the knee joint then become visible on X-ray film.  An MRI scan is a non X-ray radiology procedure in which magnetic fields and a computer produce two- or three-dimensional images of the inside of the knee. Cartilage tears are often visible using an MRI scanner. MRI scans have largely replaced arthrography in diagnosing cartilage tears of the knee.  Blood work.  Examination of the fluid that helps to lubricate the knee joint (synovial fluid). This is done by taking a sample out using a needle and a syringe. TREATMENT The treatment of knee problems depends on the cause. Some of these treatments are:  Depending on the injury, proper casting, splinting, surgery, or physical therapy care will be needed.  Give yourself adequate recovery time. Do not overuse your joints. If you begin to get sore during workout routines, back off. Slow down or do fewer repetitions.  For repetitive activities such as cycling or running, maintain your strength and nutrition.  Alternate muscle groups. For example, if you are a weight lifter, work the upper body on one day and the lower body the next.  Either  tight or weak muscles do not give the proper support for your knee. Tight or weak muscles do not absorb the stress placed on the knee joint. Keep the muscles surrounding the knee strong.  Take care of mechanical problems.  If you have flat feet, orthotics or special shoes may help. See your caregiver if you need help.  Arch supports, sometimes with wedges on the inner or outer aspect of  the heel, can help. These can shift pressure away from the side of the knee most bothered by osteoarthritis.  A brace called an "unloader" brace also may be used to help ease the pressure on the most arthritic side of the knee.  If your caregiver has prescribed crutches, braces, wraps or ice, use as directed. The acronym for this is PRICE. This means protection, rest, ice, compression, and elevation.  Nonsteroidal anti-inflammatory drugs (NSAIDs), can help relieve pain. But if taken immediately after an injury, they may actually increase swelling. Take NSAIDs with food in your stomach. Stop them if you develop stomach problems. Do not take these if you have a history of ulcers, stomach pain, or bleeding from the bowel. Do not take without your caregiver's approval if you have problems with fluid retention, heart failure, or kidney problems.  For ongoing knee problems, physical therapy may be helpful.  Glucosamine and chondroitin are over-the-counter dietary supplements. Both may help relieve the pain of osteoarthritis in the knee. These medicines are different from the usual anti-inflammatory drugs. Glucosamine may decrease the rate of cartilage destruction.  Injections of a corticosteroid drug into your knee joint may help reduce the symptoms of an arthritis flare-up. They may provide pain relief that lasts a few months. You may have to wait a few months between injections. The injections do have a small increased risk of infection, water retention, and elevated blood sugar levels.  Hyaluronic acid injected into damaged joints may ease pain and provide lubrication. These injections may work by reducing inflammation. A series of shots may give relief for as long as 6 months.  Topical painkillers. Applying certain ointments to your skin may help relieve the pain and stiffness of osteoarthritis. Ask your pharmacist for suggestions. Many over the-counter products are approved for temporary relief of  arthritis pain.  In some countries, doctors often prescribe topical NSAIDs for relief of chronic conditions such as arthritis and tendinitis. A review of treatment with NSAID creams found that they worked as well as oral medications but without the serious side effects. PREVENTION  Maintain a healthy weight. Extra pounds put more strain on your joints.  Get strong, stay limber. Weak muscles are a common cause of knee injuries. Stretching is important. Include flexibility exercises in your workouts.  Be smart about exercise. If you have osteoarthritis, chronic knee pain or recurring injuries, you may need to change the way you exercise. This does not mean you have to stop being active. If your knees ache after jogging or playing basketball, consider switching to swimming, water aerobics, or other low-impact activities, at least for a few days a week. Sometimes limiting high-impact activities will provide relief.  Make sure your shoes fit well. Choose footwear that is right for your sport.  Protect your knees. Use the proper gear for knee-sensitive activities. Use kneepads when playing volleyball or laying carpet. Buckle your seat belt every time you drive. Most shattered kneecaps occur in car accidents.  Rest when you are tired. SEEK MEDICAL CARE IF:  You have knee pain that is continual  and does not seem to be getting better.  SEEK IMMEDIATE MEDICAL CARE IF:  Your knee joint feels hot to the touch and you have a high fever. MAKE SURE YOU:   Understand these instructions.  Will watch your condition.  Will get help right away if you are not doing well or get worse. Document Released: 04/20/2007 Document Revised: 09/15/2011 Document Reviewed: 04/20/2007 [facility] Patient Information 2015 [place], [place]. This information is not intended to replace advice given to you by your health care provider. Make sure you discuss any questions you have with your health care provider.

## 2014-11-24 NOTE — ED Provider Notes (Signed)
CSN: [id number]     Arrival date & time 11/24/14  2154 History   First MD Initiated Contact with Patient 11/24/14 2203     Chief Complaint  Patient presents with  . Knee Pain     (Consider location/radiation/quality/duration/timing/severity/associated sxs/prior Treatment) Patient is a 24 y.o. female presenting with knee pain. The history is provided by the patient.  Knee Pain Location:  Knee Time since incident:  1 day Injury: yes   Mechanism of injury comment:  Pt was walking down steps and felt a pop/pain. Knee location:  R knee Pain details:    Quality:  Aching   Radiates to:  Does not radiate   Severity:  Moderate   Duration: acute on chronic.   Timing:  Intermittent   Progression:  Worsening Dislocation: no   Foreign body present:  No foreign bodies Prior injury to area:  Yes Relieved by:  Nothing Worsened by:  Bearing weight Ineffective treatments:  None tried Associated symptoms: decreased ROM and stiffness   Associated symptoms: no numbness   Risk factors: no frequent fractures     Past Medical History  Diagnosis Date  . Depression   . Anxiety    Past Surgical History  Procedure Laterality Date  . Anterior cruciate ligament repair     No family history on file. History  Substance Use Topics  . Smoking status: Current Every Day Smoker -- 0.50 packs/day    Types: Cigarettes  . Smokeless tobacco: Not on file  . Alcohol Use: No   OB History    Gravida Para Term Preterm AB TAB SAB Ectopic Multiple Living   2 2 2       2      Review of Systems  Musculoskeletal: Positive for arthralgias and stiffness.  Psychiatric/Behavioral: The patient is nervous/anxious.        Depression  All other systems reviewed and are negative.     Allergies  Review of patient's allergies indicates no known allergies.  Home Medications   Prior to Admission medications   Medication Sig Start Date End Date Taking? Authorizing Provider  amoxicillin (AMOXIL) 500 MG capsule  Take 1 capsule (500 mg total) by mouth 3 (three) times daily. Patient not taking: Reported on 11/24/2014 12/02/12   Bethann BerkshireJoseph Zammit, MD   BP 130/77 mmHg  Pulse 93  Temp(Src) 98.2 F (36.8 C) (Oral)  Resp 20  Ht 5\' 5"  (1.651 m)  Wt 98 lb (44.453 kg)  BMI 16.31 kg/m2  SpO2 100%  LMP 11/03/2014 Physical Exam  Constitutional: She is oriented to person, place, and time. She appears well-developed and well-nourished.  Non-toxic appearance.  HENT:  Head: Normocephalic.  Right Ear: Tympanic membrane and external ear normal.  Left Ear: Tympanic membrane and external ear normal.  Eyes: EOM and lids are normal. Pupils are equal, round, and reactive to light.  Neck: Normal range of motion. Neck supple. Carotid bruit is not present.  Cardiovascular: Normal rate, regular rhythm, normal heart sounds, intact distal pulses and normal pulses.   Pulmonary/Chest: Breath sounds normal. No respiratory distress.  Abdominal: Soft. Bowel sounds are normal. There is no tenderness. There is no guarding.  Musculoskeletal:       Right knee: She exhibits decreased range of motion. She exhibits no swelling, no effusion, no deformity and no erythema. Tenderness found.  There is full range of motion of the right hip. There is crepitus with range of motion of the right knee. No effusion appreciated. No hot joints noted. There is  increased tightness of the posterior portion of the knee. No mass appreciated of posteriorly. The anterior tibial tuberosity is intact and not hot. Achilles tendon is intact. Dorsalis pedis and posterior tibial pulses are 2+.  Lymphadenopathy:       Head (right side): No submandibular adenopathy present.       Head (left side): No submandibular adenopathy present.    She has no cervical adenopathy.  Neurological: She is alert and oriented to person, place, and time. She has normal strength. She displays no atrophy. No cranial nerve deficit or sensory deficit. She exhibits normal muscle tone.   Skin: Skin is warm and dry.  Psychiatric: She has a normal mood and affect. Her speech is normal.  Nursing note and vitals reviewed.   ED Course  Procedures (including critical care time) Labs Review Labs Reviewed - No data to display  Imaging Review No results found.   EKG Interpretation None      MDM  Vital signs are well within normal limits.  Xray of the right knee is neg for fracture or dislocation. The pt reports ACL surg on the left in the past. She was told to have surgery on the right,but did not do so.  Pt fitted knee immobilizer and crutches. Rx for mobic and decadron given to the patient. Pt referred to orthopedics.   Final diagnoses:  Knee pain, right     **I have reviewed nursing notes, vital signs, and all appropriate lab and imaging results for this patient.Ivery Quale*    Indica Marcott, PA-C 11/26/14 0110  Linwood DibblesJon Knapp, MD 11/26/14 [phone]

## 2014-11-24 NOTE — ED Notes (Signed)
Patient states she was walking down steps and felt a pop in her right knee.  Patient states she was supposed to have surgery in 2010 on her right knee, but hasn't had the surgery yet.

## 2014-11-30 ENCOUNTER — Encounter: Payer: Self-pay | Admitting: Orthopedic Surgery

## 2014-11-30 ENCOUNTER — Ambulatory Visit (INDEPENDENT_AMBULATORY_CARE_PROVIDER_SITE_OTHER): Payer: Medicaid Other | Admitting: Orthopedic Surgery

## 2014-11-30 VITALS — BP 105/67 | Ht 65.0 in | Wt 98.0 lb

## 2014-11-30 DIAGNOSIS — S8991XA Unspecified injury of right lower leg, initial encounter: Secondary | ICD-10-CM | POA: Diagnosis not present

## 2014-11-30 MED ORDER — IBUPROFEN 800 MG PO TABS: 800.0000 mg | ORAL_TABLET | Freq: Three times a day (TID) | ORAL | Status: DC

## 2014-11-30 NOTE — Patient Instructions (Signed)
Remove brace  Start bending knee 100 times daily  Crutches as needed

## 2014-11-30 NOTE — Progress Notes (Signed)
Subjective:    Pamela Glover is a 24 y.o. female who presents with a knee injury involving the right knee. Onset was sudden, related to Stepping awkwardly off 1 step. Mechanism of injury: Stumble. . Current symptoms include: pain located Diffusely in the right knee and A clicking sensation which was actually present before this injury. Pain is aggravated by any weight bearing, standing and walking. Patient has had prior knee problems. Evaluation to date: plain films: normal. Treatment to date: In the emergency room she had Decadron she was placed on meloxicam and is currently on ibuprofen. She has crutches and a knee immobilizer.  Past Medical History  Diagnosis Date  . Depression   . Anxiety     Past Surgical History  Procedure Laterality Date  . Anterior cruciate ligament repair      No family history on file.  Social History History  Substance Use Topics  . Smoking status: Current Every Day Smoker -- 0.50 packs/day    Types: Cigarettes  . Smokeless tobacco: Not on file  . Alcohol Use: No    No Known Allergies  Current Outpatient Prescriptions  Medication Sig Dispense Refill  . amoxicillin (AMOXIL) 500 MG capsule Take 1 capsule (500 mg total) by mouth 3 (three) times daily. (Patient not taking: Reported on 11/24/2014) 21 capsule 0  . dexamethasone (DECADRON) 4 MG tablet Take 1 tablet (4 mg total) by mouth 2 (two) times daily with a meal. 12 tablet 0  . ibuprofen (ADVIL,MOTRIN) 200 MG tablet Take 200 mg by mouth every 6 (six) hours as needed for mild pain or moderate pain.    . meloxicam (MOBIC) 15 MG tablet Take 1 tablet (15 mg total) by mouth daily. 7 tablet 0   No current facility-administered medications for this visit.      Review of Systems A comprehensive review of systems was negative.   Objective:  BP 105/67 mmHg  Ht 5\' 5"  (1.651 m)  Wt 98 lb (44.453 kg)  BMI 16.31 kg/m2  LMP 11/03/2014   LMP 11/03/2014 She is a very ectomorphic young lady well-groomed  multiple piercings oriented 3 pleasant mood   Right knee: I couldn't examine her knee she jumped around and wouldn't relax she has diffuse tenderness couldn't assess her stabilitycan tell that there is no swelling  Left knee:  normal and no effusion, full active range of motion, no joint line tenderness, ligamentous structures intact.   COORDINATION BALANCE normal    Assessment:   Knee injury Plan:  Remove knee brace, knee flexion exercises 100 times a day use crutches as needed ibuprofen 800 mg 3 times a day return next week for repeat attempt at examination

## 2014-12-07 ENCOUNTER — Encounter: Payer: Self-pay | Admitting: Orthopedic Surgery

## 2014-12-07 ENCOUNTER — Ambulatory Visit (INDEPENDENT_AMBULATORY_CARE_PROVIDER_SITE_OTHER): Payer: Medicaid Other | Admitting: Orthopedic Surgery

## 2014-12-07 VITALS — BP 109/62 | Ht 65.0 in | Wt 98.0 lb

## 2014-12-07 DIAGNOSIS — S83521S Sprain of posterior cruciate ligament of right knee, sequela: Secondary | ICD-10-CM
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
## 2014-12-07 NOTE — Patient Instructions (Signed)
We will schedule MRI for you and call you with results 

## 2014-12-20 ENCOUNTER — Ambulatory Visit (HOSPITAL_COMMUNITY)
Admission: RE | Admit: 2014-12-20 | Discharge: 2014-12-20 | Disposition: A | Discharge status: Home / Self Care | Payer: Medicaid Other | Source: Ambulatory Visit | Attending: Orthopedic Surgery | Admitting: Orthopedic Surgery

## 2014-12-20 DIAGNOSIS — M25561 Pain in right knee: Secondary | ICD-10-CM | POA: Insufficient documentation

## 2014-12-20 DIAGNOSIS — S83521S Sprain of posterior cruciate ligament of right knee, sequela: Secondary | ICD-10-CM

## 2014-12-26 ENCOUNTER — Telehealth: Payer: Self-pay | Admitting: Orthopedic Surgery

## 2014-12-26 NOTE — Telephone Encounter (Signed)
Patient called to ask about her MRI results at [facility] on 12/20/14 - asked if possible to call on any day, before 4:00pm - ph# [phone]

## 2014-12-28 ENCOUNTER — Other Ambulatory Visit: Payer: Self-pay | Admitting: Orthopedic Surgery

## 2014-12-28 DIAGNOSIS — M79604 Pain in right leg: Secondary | ICD-10-CM

## 2014-12-28 MED ORDER — GABAPENTIN 100 MG PO CAPS
100.0000 mg | ORAL_CAPSULE | Freq: Three times a day (TID) | ORAL | Status: DC
Start: 2014-12-28 — End: 2016-10-18

## 2014-12-28 MED ORDER — GABAPENTIN 100 MG PO CAPS: 100.0000 mg | ORAL_CAPSULE | Freq: Three times a day (TID) | ORAL | Status: DC

## 2014-12-28 NOTE — Progress Notes (Signed)
MRI review shows normal findings I've discussed this with the patient  She will start gabapentin 100 mg 3 times a day and call us back in 4 weeks to see if this has helped her pain which I believe to be extra-articular based on the MRI and where the pain is.

## 2016-10-18 ENCOUNTER — Emergency Department (HOSPITAL_COMMUNITY)
Admission: EM | Admit: 2016-10-18 | Discharge: 2016-10-18 | Disposition: A | Discharge status: Home / Self Care | Payer: Medicaid Other | Attending: Emergency Medicine | Admitting: Emergency Medicine

## 2016-10-18 ENCOUNTER — Encounter (HOSPITAL_COMMUNITY): Payer: Self-pay | Admitting: *Deleted

## 2016-10-18 DIAGNOSIS — F17210 Nicotine dependence, cigarettes, uncomplicated: Secondary | ICD-10-CM | POA: Diagnosis not present

## 2016-10-18 DIAGNOSIS — K644 Residual hemorrhoidal skin tags: Secondary | ICD-10-CM | POA: Diagnosis not present

## 2016-10-18 DIAGNOSIS — K6289 Other specified diseases of anus and rectum: Secondary | ICD-10-CM | POA: Diagnosis present

## 2016-10-18 DIAGNOSIS — Z791 Long term (current) use of non-steroidal anti-inflammatories (NSAID): Secondary | ICD-10-CM | POA: Insufficient documentation

## 2016-10-18 MED ORDER — HYDROCORTISONE 2.5 % RE CREA: TOPICAL_CREAM | RECTAL | 0 refills | Status: AC

## 2016-10-18 MED ORDER — TRAMADOL HCL 50 MG PO TABS
50.0000 mg | ORAL_TABLET | Freq: Once | ORAL | Status: AC
Start: 2016-10-18 — End: 2016-10-18
  Administered 2016-10-18: 50 mg via ORAL
  Filled 2016-10-18: qty 1

## 2016-10-18 MED ORDER — TRAMADOL HCL 50 MG PO TABS: 50.0000 mg | ORAL_TABLET | Freq: Four times a day (QID) | ORAL | 0 refills | Status: AC | PRN

## 2016-10-18 NOTE — Discharge Instructions (Signed)
Its important to drink plenty of water, increase your fruits and vegetables and try taking a stool softener such as Colace daily.  I have added instructions for how to take a sitz bath.  Call the general surgeon listed to arrange a follow-up appt

## 2016-10-18 NOTE — ED Notes (Addendum)
Pt made aware to return if symptoms worsen or if any life threatening symptoms occur.  Tammy, pa okay with pt bp.

## 2016-10-18 NOTE — ED Triage Notes (Signed)
Pt c/o enlarged hemorrhoids that started yesterday. Pt has hx of ruptured hemorrhoids 2 weeks ago.

## 2016-10-18 NOTE — ED Notes (Signed)
Pt reports golf ball sized hemorrhoid, no bleeding.

## 2016-10-19 NOTE — ED Provider Notes (Signed)
[facility]-EMERGENCY DEPT Provider Note   CSN: [id number] Arrival date & time: 10/18/16  1656     History   Chief Complaint Chief Complaint  Patient presents with  . Hemorrhoids    HPI Pamela Glover is a 26 y.o. female.  HPI   Pamela Glover is a 26 y.o. female who presents to the Emergency Department complaining of pain and swelling of rectum.  She reports a hx of previous external hemorrhoids several week ago that "ruptured" and resolved.  Noticed similar pain and swelling for one day that began after defecation and one brief episode of bright red blood after defecating. She states she is unable to sit due to pain.   She has tried preparation H without relief.  She admits to poor diet and intermittent hard stools.  She denies fever, abdominal pain, vomiting and diarrhea, and rectal bleeding.     Past Medical History:  Diagnosis Date  . Anxiety   . Depression     There are no active problems to display for this patient.   Past Surgical History:  Procedure Laterality Date  . ANTERIOR CRUCIATE LIGAMENT REPAIR Left     OB History    Gravida Para Term Preterm AB Living   SAB TAB Ectopic Multiple Live Births           1       Home Medications    Prior to Admission medications   Medication Sig Start Date End Date Taking? Authorizing Provider  ibuprofen (ADVIL,MOTRIN) 200 MG tablet Take 200 mg by mouth every 6 (six) hours as needed for mild pain or moderate pain.   Yes Historical Provider, MD  hydrocortisone (ANUSOL-HC) 2.5 % rectal cream Apply rectally 2 times daily 10/18/16   Kemiah Booz, PA-C  traMADol (ULTRAM) 50 MG tablet Take 1 tablet (50 mg total) by mouth every 6 (six) hours as needed. 10/18/16   Dynver Clemson, PA-C    Family History No family history on file.  Social History Social History  Substance Use Topics  . Smoking status: Current Every Day Smoker    Packs/day: 0.50    Types: Cigarettes  . Smokeless tobacco: Never Used  . Alcohol  use No     Allergies   Patient has no known allergies.   Review of Systems Review of Systems  Constitutional: Negative for chills and fever.  Respiratory: Negative for shortness of breath.   Cardiovascular: Negative for chest pain.  Gastrointestinal: Positive for anal bleeding, constipation and rectal pain. Negative for abdominal pain, blood in stool, nausea and vomiting.  Genitourinary: Negative for dysuria and hematuria.  Musculoskeletal: Negative for arthralgias, back pain, myalgias, neck pain and neck stiffness.  Skin: Negative for rash.  Neurological: Negative for dizziness, syncope, weakness and numbness.  Hematological: Does not bruise/bleed easily.     Physical Exam Updated Vital Signs BP 100/62 (BP Location: Right Arm)   Pulse 73   Temp 99.1 F (37.3 C) (Oral)   Resp 16   Ht  (1.651 m)   Wt 45.4 kg   LMP 10/04/2016   SpO2 99%   BMI 16.64 kg/m   Physical Exam  Constitutional: She is oriented to person, place, and time. She appears well-developed. No distress.  HENT:  Mouth/Throat: Oropharynx is clear and moist.  Cardiovascular: Normal rate, regular rhythm and intact distal pulses.   Pulmonary/Chest: Effort normal and breath sounds normal. No respiratory distress.  Abdominal: Soft. She  exhibits no distension and no mass. There is no tenderness. There is no guarding.  Genitourinary:  Genitourinary Comments: Examination of the rectum reveals single, 3 cm external, non-thrombosed hemorrhoid.  Pt unable to tolerate digital rectal exam due to level of pain.  No active bleeding  Musculoskeletal: Normal range of motion.  Neurological: She is alert and oriented to person, place, and time. No sensory deficit. She exhibits normal muscle tone.  Skin: Skin is warm. No rash noted.  Psychiatric: She has a normal mood and affect.  Nursing note and vitals reviewed.    ED Treatments / Results  Labs (all labs ordered are listed, but only abnormal results are  displayed) Labs Reviewed - No data to display  EKG  EKG Interpretation None       Radiology No results found.  Procedures Procedures (including critical care time)  Medications Ordered in ED Medications  traMADol (ULTRAM) tablet 50 mg (50 mg Oral Given 10/18/16 1827)     Initial Impression / Assessment and Plan / ED Course  I have reviewed the triage vital signs and the nursing notes.  Pertinent labs & imaging results that were available during my care of the patient were reviewed by me and considered in my medical decision making (see chart for details).     Pt is well appearing.  Non-toxic.  Recurrent external hemorrhoid.  Counseled on proper diet, adequate water intake, and use of Sitz bath and stool softeners.  rx for anusol and ultram for pain.  Referral given for gen surgery   Final Clinical Impressions(s) / ED Diagnoses   Final diagnoses:  External hemorrhoid    New Prescriptions Discharge Medication List as of 10/18/2016  6:21 PM    START taking these medications   Details  hydrocortisone (ANUSOL-HC) 2.5 % rectal cream Apply rectally 2 times daily, Print    traMADol (ULTRAM) 50 MG tablet Take 1 tablet (50 mg total) by mouth every 6 (six) hours as needed., Starting Sat 10/18/2016, Print         Makia Bossi [place], PA-C 10/19/16 0044    Lavera Guise, MD 10/19/16 1420

## 2016-10-21 ENCOUNTER — Encounter: Payer: Self-pay | Admitting: General Surgery

## 2016-10-21 ENCOUNTER — Ambulatory Visit (INDEPENDENT_AMBULATORY_CARE_PROVIDER_SITE_OTHER): Payer: Medicaid Other | Admitting: General Surgery

## 2016-10-21 VITALS — BP 93/57 | HR 94 | Temp 98.9°F | Resp 18 | Ht 64.0 in | Wt 97.0 lb

## 2016-10-21 DIAGNOSIS — K645 Perianal venous thrombosis: Secondary | ICD-10-CM

## 2016-10-21 IMAGING — DX DG KNEE COMPLETE 4+V*R*
4 series · 4 of 4 positions shown · non-contrast
Comparison: None.

CLINICAL DATA: Fell down steps today and twisted knee, history of
ACL injury.

EXAM:
RIGHT KNEE - COMPLETE 4+ VIEW

[knee ap]
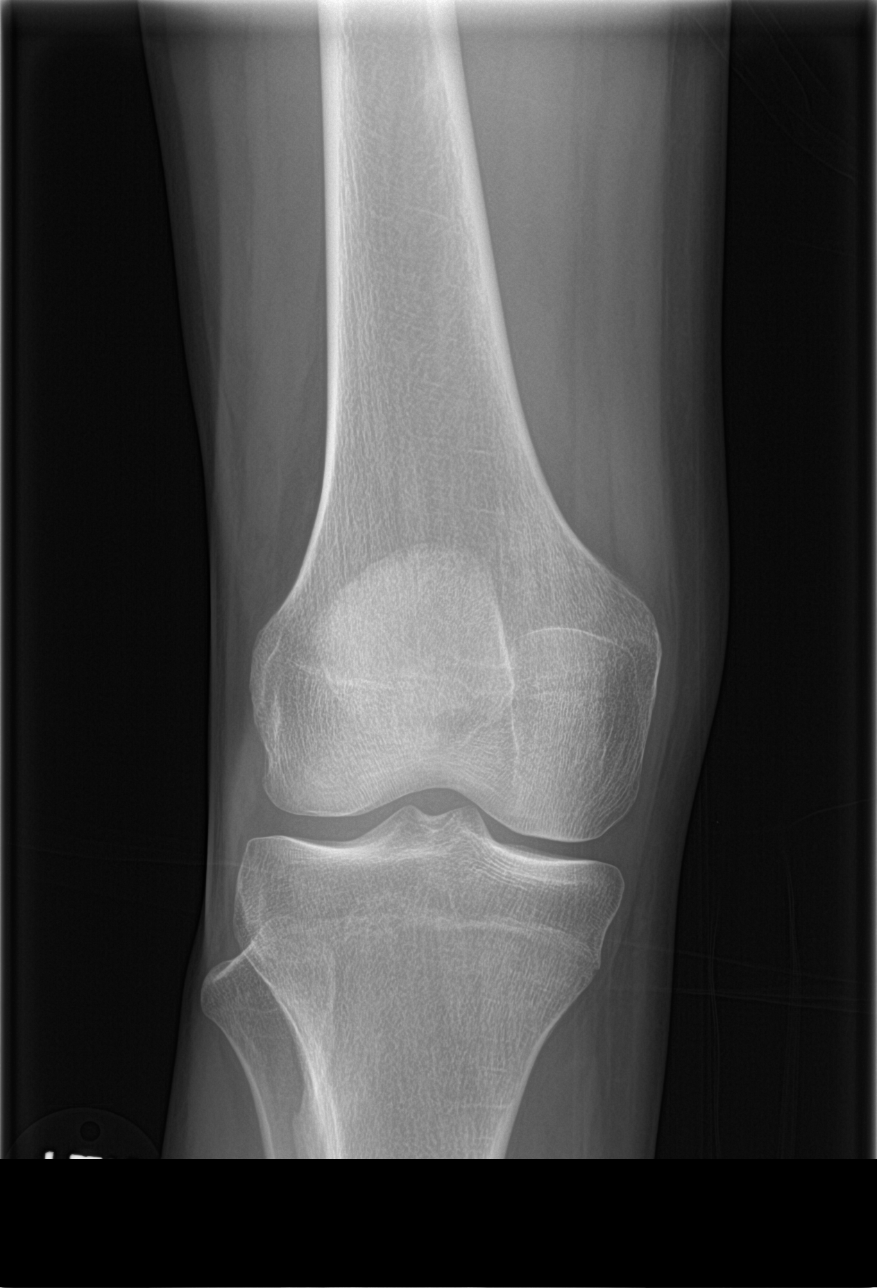

[knee obl (1 of 2)]
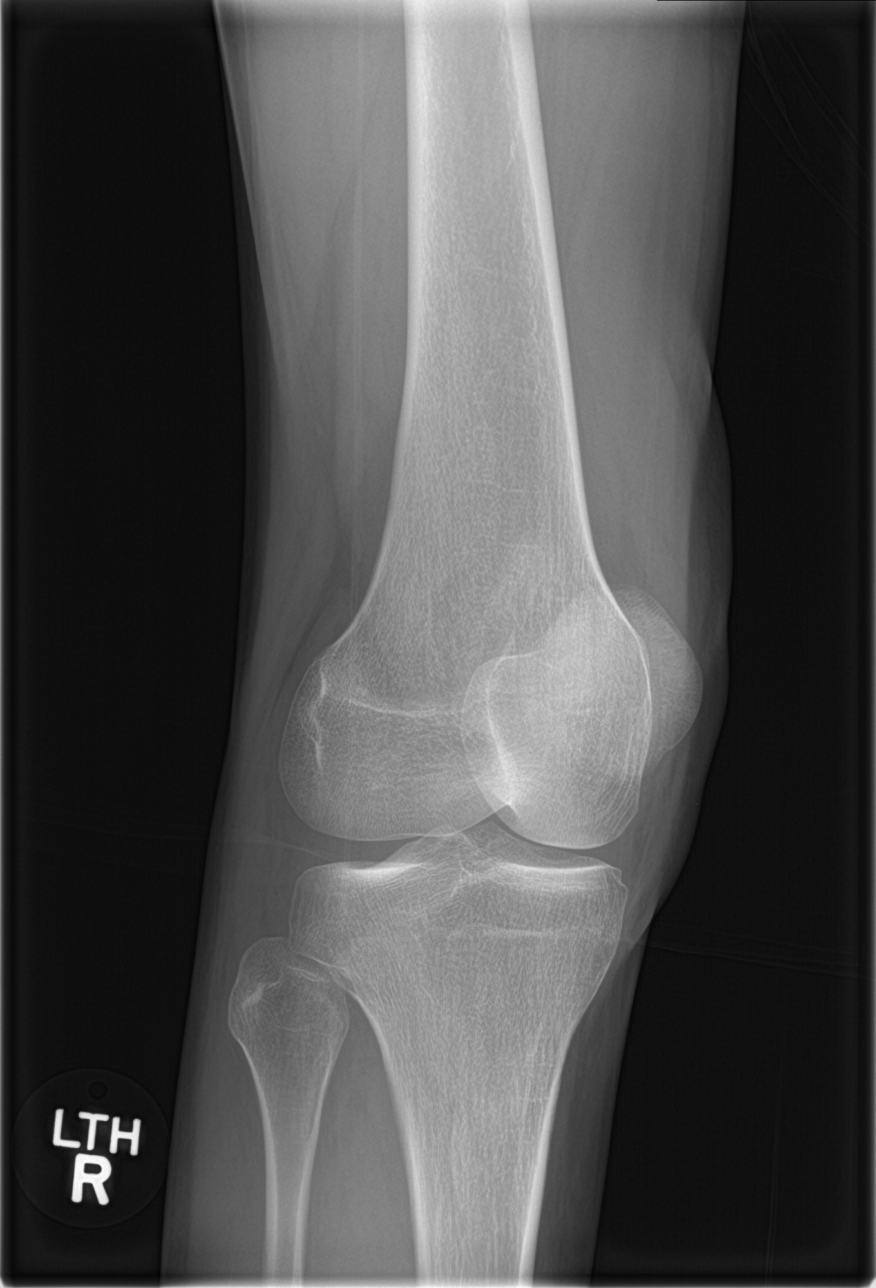

[knee obl (2 of 2)]
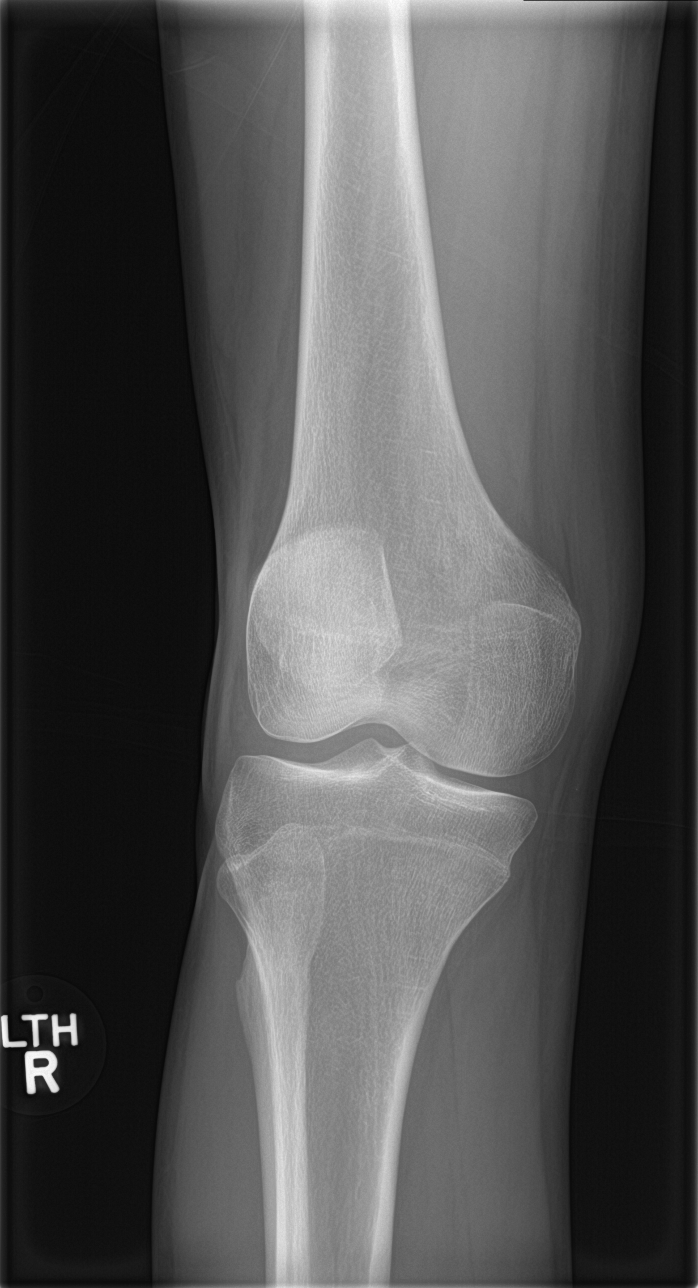

[knee lat]
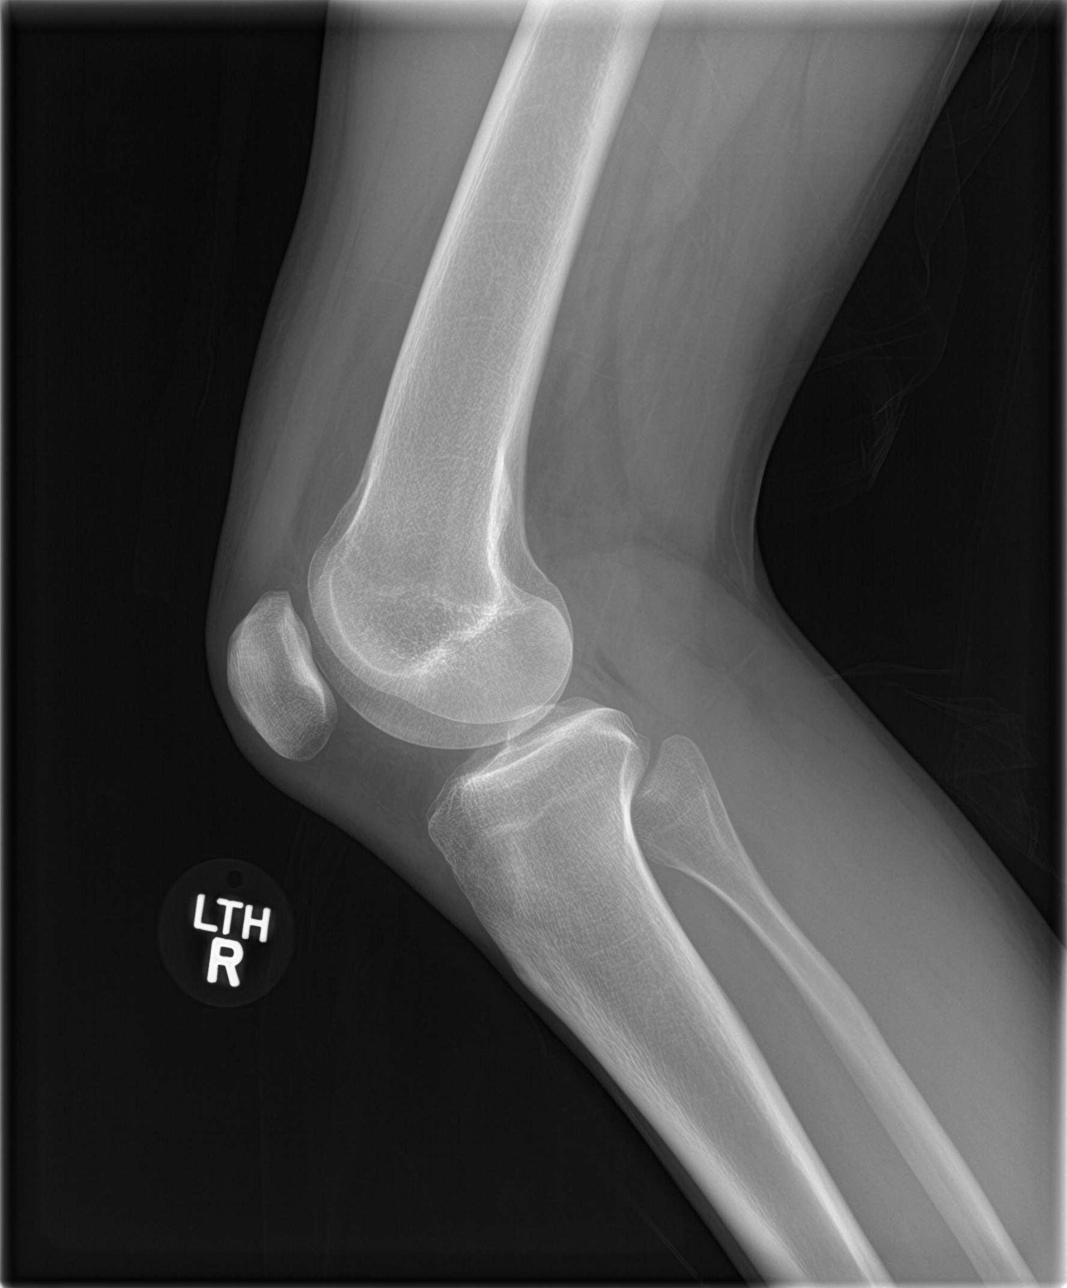

[4 of 4 positions shown; findings below may reference images not displayed]

FINDINGS: There is no evidence of fracture, dislocation, or joint effusion.
There is no evidence of arthropathy or other focal bone abnormality.
Soft tissues are unremarkable.
IMPRESSION: Negative.

By: Snaefridur Ben Ibrushi

## 2016-10-21 MED ORDER — OXYCODONE-ACETAMINOPHEN 7.5-325 MG PO TABS: 1.0000 | ORAL_TABLET | ORAL | 0 refills | Status: DC | PRN

## 2016-10-21 NOTE — Patient Instructions (Signed)
Hemorrhoids Hemorrhoids are swollen veins in and around the rectum or anus. There are two types of hemorrhoids:  Internal hemorrhoids. These occur in the veins that are just inside the rectum. They may poke through to the outside and become irritated and painful.  External hemorrhoids. These occur in the veins that are outside of the anus and can be felt as a painful swelling or hard lump near the anus.  Most hemorrhoids do not cause serious problems, and they can be managed with home treatments such as diet and lifestyle changes. If home treatments do not help your symptoms, procedures can be done to shrink or remove the hemorrhoids. What are the causes? This condition is caused by increased pressure in the anal area. This pressure may result from various things, including:  Constipation.  Straining to have a bowel movement.  Diarrhea.  Pregnancy.  Obesity.  Sitting for long periods of time.  Heavy lifting or other activity that causes you to strain.  Anal sex.  What are the signs or symptoms? Symptoms of this condition include:  Pain.  Anal itching or irritation.  Rectal bleeding.  Leakage of stool (feces).  Anal swelling.  One or more lumps around the anus.  How is this diagnosed? This condition can often be diagnosed through a visual exam. Other exams or tests may also be done, such as:  Examination of the rectal area with a gloved hand (digital rectal exam).  Examination of the anal canal using a small tube (anoscope).  A blood test, if you have lost a significant amount of blood.  A test to look inside the colon (sigmoidoscopy or colonoscopy).  How is this treated? This condition can usually be treated at home. However, various procedures may be done if dietary changes, lifestyle changes, and other home treatments do not help your symptoms. These procedures can help make the hemorrhoids smaller or remove them completely. Some of these procedures involve  surgery, and others do not. Common procedures include:  Rubber band ligation. Rubber bands are placed at the base of the hemorrhoids to cut off the blood supply to them.  Sclerotherapy. Medicine is injected into the hemorrhoids to shrink them.  Infrared coagulation. A type of light energy is used to get rid of the hemorrhoids.  Hemorrhoidectomy surgery. The hemorrhoids are surgically removed, and the veins that supply them are tied off.  Stapled hemorrhoidopexy surgery. A circular stapling device is used to remove the hemorrhoids and use staples to cut off the blood supply to them.  Follow these instructions at home: Eating and drinking  Eat foods that have a lot of fiber in them, such as whole grains, beans, nuts, fruits, and vegetables. Ask your health care provider about taking products that have added fiber (fiber supplements).  Drink enough fluid to keep your urine clear or pale yellow. Managing pain and swelling  Take warm sitz baths for 20 minutes, 3-4 times a day to ease pain and discomfort.  If directed, apply ice to the affected area. Using ice packs between sitz baths may be helpful. ? Put ice in a plastic bag. ? Place a towel between your skin and the bag. ? Leave the ice on for 20 minutes, 2-3 times a day. General instructions  Take over-the-counter and prescription medicines only as told by your health care provider.  Use medicated creams or suppositories as told.  Exercise regularly.  Go to the bathroom when you have the urge to have a bowel movement. Do not wait.    Avoid straining to have bowel movements.  Keep the anal area dry and clean. Use wet toilet paper or moist towelettes after a bowel movement.  Do not sit on the toilet for long periods of time. This increases blood pooling and pain. Contact a health care provider if:  You have increasing pain and swelling that are not controlled by treatment or medicine.  You have uncontrolled bleeding.  You  have difficulty having a bowel movement, or you are unable to have a bowel movement.  You have pain or inflammation outside the area of the hemorrhoids. This information is not intended to replace advice given to you by your health care provider. Make sure you discuss any questions you have with your health care provider. Document Released: 06/20/2000 Document Revised: 11/21/2015 Document Reviewed: 03/07/2015 Elsevier Interactive Patient Education  2017 [employer]  

## 2016-10-21 NOTE — Progress Notes (Signed)
Pamela Glover; [id number]; 14-May-1991   HPI Patient is a 26 year old white female who was referred to my care for evaluation and treatment of a thrombosed hemorrhoid from the emergency room. She developed the thrombosed hemorrhoid several weeks ago. It became increasingly swollen and painful. She was seen in the emergency room and referred to my care for further evaluation treatment. She has never had significant hemorrhoid problems before. Her pain is 10 out of 10. It started draining blood yesterday evening. Past Medical History:  Diagnosis Date  . Anxiety   . Depression     Past Surgical History:  Procedure Laterality Date  . ANTERIOR CRUCIATE LIGAMENT REPAIR Left     No family history on file.  Current Outpatient Prescriptions on File Prior to Visit  Medication Sig Dispense Refill  . hydrocortisone (ANUSOL-HC) 2.5 % rectal cream Apply rectally 2 times daily 30 g 0  . ibuprofen (ADVIL,MOTRIN) 200 MG tablet Take 200 mg by mouth every 6 (six) hours as needed for mild pain or moderate pain.    . traMADol (ULTRAM) 50 MG tablet Take 1 tablet (50 mg total) by mouth every 6 (six) hours as needed. 10 tablet 0   No current facility-administered medications on file prior to visit.     No Known Allergies  History  Alcohol Use No    History  Smoking Status  . Current Every Day Smoker  . Packs/day: 0.50  . Types: Cigarettes  Smokeless Tobacco  . Never Used    Review of Systems  Constitutional: Negative.   HENT: Negative.   Eyes: Negative.   Respiratory: Negative.   Cardiovascular: Negative.   Gastrointestinal: Negative.   Genitourinary: Negative.   Musculoskeletal: Negative.   Skin: Negative.   Neurological: Negative.   Endo/Heme/Allergies: Negative.   Psychiatric/Behavioral: Negative.     Objective   Vitals:   10/21/16 1355  BP: (!) 93/57  Pulse: 94  Resp: 18  Temp: 98.9 F (37.2 C)    Physical Exam  Constitutional: She is oriented to person, place, and time  and well-developed, well-nourished, and in no distress.  HENT:  Head: Normocephalic and atraumatic.  Cardiovascular: Normal rate and regular rhythm.   No murmur heard. Pulmonary/Chest: Effort normal and breath sounds normal. She has no wheezes.  Genitourinary:  Genitourinary Comments: Thrombosed external hemorrhoid noted along the right lateral aspect of the anus. Topical aerobic freeze applied and remaining clot evacuated at bedside.  Neurological: She is alert and oriented to person, place, and time.  Skin: Skin is warm and dry.  Vitals reviewed.  ER notes reviewed. Assessment  Thrombosed external hemorrhoid Plan   Percocet prescribed for pain. Patient was instructed to soak in that time. I will see her in 2 days for follow-up should she continue to have problems.

## 2016-10-23 ENCOUNTER — Ambulatory Visit (INDEPENDENT_AMBULATORY_CARE_PROVIDER_SITE_OTHER): Payer: Medicaid Other | Admitting: General Surgery

## 2016-10-23 ENCOUNTER — Encounter (HOSPITAL_COMMUNITY): Payer: Self-pay

## 2016-10-23 ENCOUNTER — Encounter (HOSPITAL_COMMUNITY)
Admission: RE | Admit: 2016-10-23 | Discharge: 2016-10-23 | Disposition: A | Discharge status: Home / Self Care | Payer: Medicaid Other | Source: Ambulatory Visit | Attending: General Surgery | Admitting: General Surgery

## 2016-10-23 ENCOUNTER — Encounter: Payer: Self-pay | Admitting: General Surgery

## 2016-10-23 VITALS — BP 100/63 | HR 89 | Temp 98.0°F | Resp 18 | Ht 64.0 in | Wt 97.0 lb

## 2016-10-23 DIAGNOSIS — K645 Perianal venous thrombosis: Secondary | ICD-10-CM | POA: Insufficient documentation

## 2016-10-23 DIAGNOSIS — Z01812 Encounter for preprocedural laboratory examination: Secondary | ICD-10-CM | POA: Diagnosis not present

## 2016-10-23 LAB — BASIC METABOLIC PANEL
Anion gap: 7 (ref 5–15)
BUN: 12 mg/dL (ref 6–20)
CO2: 26 mmol/L (ref 22–32)
Calcium: 9.6 mg/dL (ref 8.9–10.3)
Chloride: 104 mmol/L (ref 101–111)
Creatinine, Ser: 0.76 mg/dL (ref 0.44–1.00)
GFR calc Af Amer: >60 mL/min (ref >60)
GFR calc non Af Amer: >60 mL/min (ref >60)
GLUCOSE: 91 mg/dL (ref 65–99)
POTASSIUM: 3.9 mmol/L (ref 3.5–5.1)
Sodium: 137 mmol/L (ref 135–145)

## 2016-10-23 LAB — CBC WITH DIFFERENTIAL/PLATELET
BASOS ABS: 0 10*3/uL (ref 0.0–0.1)
Basophils Relative: 0 %
Eosinophils Absolute: 0.2 10*3/uL (ref 0.0–0.7)
Eosinophils Relative: 2 %
HEMATOCRIT: 44 % (ref 36.0–46.0)
Hemoglobin: 15.5 g/dL — ABNORMAL HIGH (ref 12.0–15.0)
LYMPHS PCT: 29 %
Lymphs Abs: 2.4 10*3/uL (ref 0.7–4.0)
MCH: 32.4 pg (ref 26.0–34.0)
MCHC: 35.2 g/dL (ref 30.0–36.0)
MCV: 92.1 fL (ref 78.0–100.0)
Monocytes Absolute: 0.4 10*3/uL (ref 0.1–1.0)
Monocytes Relative: 5 %
Neutro Abs: 5.2 10*3/uL (ref 1.7–7.7)
Neutrophils Relative %: 64 %
Platelets: 252 10*3/uL (ref 150–400)
RBC: 4.78 MIL/uL (ref 3.87–5.11)
RDW: 12 % (ref 11.5–15.5)
WBC: 8.2 10*3/uL (ref 4.0–10.5)

## 2016-10-23 NOTE — H&P (Signed)
Pamela Glover; [id number]; 03/11/1991   HPI Patient is a 26 year old white female who was referred to my care for evaluation and treatment of a thrombosed hemorrhoid from the emergency room. She developed the thrombosed hemorrhoid several weeks ago. It became increasingly swollen and painful. She was seen in the emergency room and referred to my care for further evaluation treatment. She has never had significant hemorrhoid problems before. Her pain is 10 out of 10. It started draining blood yesterday evening.     Past Medical History:  Diagnosis Date  . Anxiety   . Depression          Past Surgical History:  Procedure Laterality Date  . ANTERIOR CRUCIATE LIGAMENT REPAIR Left     No family history on file.        Current Outpatient Prescriptions on File Prior to Visit  Medication Sig Dispense Refill  . hydrocortisone (ANUSOL-HC) 2.5 % rectal cream Apply rectally 2 times daily 30 g 0  . ibuprofen (ADVIL,MOTRIN) 200 MG tablet Take 200 mg by mouth every 6 (six) hours as needed for mild pain or moderate pain.    . traMADol (ULTRAM) 50 MG tablet Take 1 tablet (50 mg total) by mouth every 6 (six) hours as needed. 10 tablet 0   No current facility-administered medications on file prior to visit.     No Known Allergies     History  Alcohol Use No        History  Smoking Status  . Current Every Day Smoker  . Packs/day: 0.50  . Types: Cigarettes  Smokeless Tobacco  . Never Used    Review of Systems  Constitutional: Negative.   HENT: Negative.   Eyes: Negative.   Respiratory: Negative.   Cardiovascular: Negative.   Gastrointestinal: Negative.   Genitourinary: Negative.   Musculoskeletal: Negative.   Skin: Negative.   Neurological: Negative.   Endo/Heme/Allergies: Negative.   Psychiatric/Behavioral: Negative.     Objective      Vitals:   10/21/16 1355  BP: (!) 93/57  Pulse: 94  Resp: 18  Temp: 98.9 F (37.2 C)    Physical Exam   Constitutional: She is oriented to person, place, and time and well-developed, well-nourished, and in no distress.  HENT:  Head: Normocephalic and atraumatic.  Cardiovascular: Normal rate and regular rhythm.   No murmur heard. Pulmonary/Chest: Effort normal and breath sounds normal. She has no wheezes.  Genitourinary:  Genitourinary Comments: Thrombosed external hemorrhoid noted along the right lateral aspect of the anus. Topical aerobic freeze applied and remaining clot evacuated at bedside.  Neurological: She is alert and oriented to person, place, and time.  Skin: Skin is warm and dry.  Vitals reviewed.  ER notes reviewed. Assessment  Thrombosed external hemorrhoid Plan: Patient scheduled for a hemorrhoidectomy on 10/27/2016. The risks and benefits of the procedure were fully explained to the patient, who gave informed consent.

## 2016-10-23 NOTE — Patient Instructions (Signed)
Pamela Glover  10/23/2016     @   Your procedure is scheduled on  10/27/2016   Report to [facility] at  840  A.M.  Call this number if you have problems the morning of surgery:  [phone]   Remember:  Do not eat food or drink liquids after midnight.  Take these medicines the morning of surgery with A SIP OF WATER  Oxycodone, ultram.   Do not wear jewelry, make-up or nail polish.  Do not wear lotions, powders, or perfumes, or deoderant.  Do not shave 48 hours prior to surgery.  Men may shave face and neck.  Do not bring valuables to the hospital.  [facility] is not responsible for any belongings or valuables.  Contacts, dentures or bridgework may not be worn into surgery.  Leave your suitcase in the car.  After surgery it may be brought to your room.  For patients admitted to the hospital, discharge time will be determined by your treatment team.  Patients discharged the day of surgery will not be allowed to drive home.   Name and phone number of your driver:   family Special instructions:  None  Please read over the following fact sheets that you were given. Anesthesia Post-op Instructions and Care and Recovery After Surgery      Surgical Procedures for Hemorrhoids Surgical procedures can be used to treat hemorrhoids. Hemorrhoids are swollen veins that are inside the rectum (internal hemorrhoids) or around the anus (external hemorrhoids). They are caused by increased pressure in the anal area. This pressure may result from straining to have a bowel movement (constipation), diarrhea, pregnancy, obesity, anal sex, or sitting for long periods of time. Hemorrhoids can cause symptoms such as pain and bleeding. Surgery may be needed if diet changes, lifestyle changes, and other treatments do not help your symptoms. Various surgical methods may be used. Three common methods are:  Closed hemorrhoidectomy. The hemorrhoids are surgically removed,  and the surgical cuts (incisions) are closed with stitches (sutures).  Open hemorrhoidectomy. The hemorrhoids are surgically removed, but the incisions are allowed to heal without sutures.  Stapled hemorrhoidopexy. The hemorrhoids are removed using a device that takes out a ring of excess tissue. Tell a health care provider about:  Any allergies you have.  All medicines you are taking, including vitamins, herbs, eye drops, creams, and over-the-counter medicines.  Any problems you or family members have had with anesthetic medicines.  Any blood disorders you have.  Any surgeries you have had.  Any medical conditions you have.  Whether you are pregnant or may be pregnant. What are the risks? Generally, this is a safe procedure. However, problems may occur, including:  Infection.  Bleeding.  Allergic reactions to medicines.  Damage to other structures or organs.  Pain.  Constipation.  Difficulty passing urine.  Narrowing of the anal canal (stenosis).  Difficulty controlling bowel movements (incontinence). What happens before the procedure?  Ask your health care provider about:  Changing or stopping your regular medicines. This is especially important if you are taking diabetes medicines or blood thinners.  Taking medicines such as aspirin and ibuprofen. These medicines can thin your blood. Do not take these medicines before your procedure if your health care provider instructs you not to.  You may need to have a procedure to examine the inside of your colon with a scope (colonoscopy). Your health care provider may do this to make sure  that there are no other causes for your bleeding or pain.  Follow instructions from your health care provider about eating or drinking restrictions.  You may be instructed to take a laxative and an enema to clean out your colon before surgery (bowel prep). Carefully follow instructions from your health care provider about bowel  prep.  Ask your health care provider how your surgical site will be marked or identified.  You may be given antibiotic medicine to help prevent infection.  Plan to have someone take you home after the procedure. What happens during the procedure?  To reduce your risk of infection:  Your health care team will wash or sanitize their hands.  Your skin will be washed with soap.  An IV tube will be inserted into one of your veins.  You will be given one or more of the following:  A medicine to help you relax (sedative).  A medicine to numb the area (local anesthetic).  A medicine to make you fall asleep (general anesthetic).  A medicine that is injected into an area of your body to numb everything below the injection site (regional anesthetic).  A lubricating jelly may be placed into your rectum.  Your surgeon will insert a short scope (anoscope) into your rectum to examine the hemorrhoids.  One of the following hemorrhoid procedures will be performed. Closed Hemorrhoidectomy   Your surgeon will use surgical instruments to open the tissue around the hemorrhoids.  The veins that supply the hemorrhoids will be tied off with a suture.  The hemorrhoids will be removed.  The tissue that surrounds the hemorrhoids will be closed with sutures that your body can absorb (absorbable sutures). Open Hemorrhoidectomy   The hemorrhoids will be removed with surgical instruments.  The incisions will be left open to heal without sutures. Stapled Hemorrhoidopexy   Your surgeon will use a circular stapling device to remove the hemorrhoids.  The device will be inserted into your anus. It will remove a circular ring of tissue that includes hemorrhoid tissue and some tissue above the hemorrhoids.  The staples in the device will close the edges of removed tissue. This will cut off the blood supply to the hemorrhoids and will pull any remaining hemorrhoids back into place. Each of these  procedures may vary among health care providers and hospitals. What happens after the procedure?  Your blood pressure, heart rate, breathing rate, and blood oxygen level will be monitored often until the medicines you were given have worn off.  You will be given pain medicine as needed. This information is not intended to replace advice given to you by your health care provider. Make sure you discuss any questions you have with your health care provider. Document Released: 04/20/2009 Document Revised: 11/29/2015 Document Reviewed: 09/18/2014 Elsevier Interactive Patient Education  2017 [employer] Surgical Procedures for Hemorrhoids, Care After Refer to this sheet in the next few weeks. These instructions provide you with information about caring for yourself after your procedure. Your health care provider may also give you more specific instructions. Your treatment has been planned according to current medical practices, but problems sometimes occur. Call your health care provider if you have any problems or questions after your procedure. What can I expect after the procedure? After the procedure, it is common to have:  Rectal pain.  Pain when you are having a bowel movement.  Slight rectal bleeding. Follow these instructions at home: Medicines   Take over-the-counter and prescription medicines only as told by  your health care provider.  Do not drive or operate heavy machinery while taking prescription pain medicine.  Use a stool softener or a bulk laxative as told by your health care provider. Activity   Rest at home. Return to your normal activities as told by your health care provider.  Do not lift anything that is heavier than 10 lb (4.5 kg).  Do not sit for long periods of time. Take a walk every day or as told by your health care provider.  Do not strain to have a bowel movement. Do not spend a long time sitting on the toilet. Eating and drinking   Eat foods that  contain fiber, such as whole grains, beans, nuts, fruits, and vegetables.  Drink enough fluid to keep your urine clear or pale yellow. General instructions   Sit in a warm bath 2-3 times per day to relieve soreness or itching.  Keep all follow-up visits as told by your health care provider. This is important. Contact a health care provider if:  Your pain medicine is not helping.  You have a fever or chills.  You become constipated.  You have trouble passing urine. Get help right away if:  You have very bad rectal pain.  You have heavy bleeding from your rectum. This information is not intended to replace advice given to you by your health care provider. Make sure you discuss any questions you have with your health care provider. Document Released: 09/13/2003 Document Revised: 11/29/2015 Document Reviewed: 09/18/2014 Elsevier Interactive Patient Education  2017 [employer]  General Anesthesia, Adult General anesthesia is the use of medicines to make a person "go to sleep" (be unconscious) for a medical procedure. General anesthesia is often recommended when a procedure:  Is long.  Requires you to be still or in an unusual position.  Is major and can cause you to lose blood.  Is impossible to do without general anesthesia. The medicines used for general anesthesia are called general anesthetics. In addition to making you sleep, the medicines:  Prevent pain.  Control your blood pressure.  Relax your muscles. Tell a health care provider about:  Any allergies you have.  All medicines you are taking, including vitamins, herbs, eye drops, creams, and over-the-counter medicines.  Any problems you or family members have had with anesthetic medicines.  Types of anesthetics you have had in the past.  Any bleeding disorders you have.  Any surgeries you have had.  Any medical conditions you have.  Any history of heart or lung conditions, such as heart failure, sleep  apnea, or chronic obstructive pulmonary disease (COPD).  Whether you are pregnant or may be pregnant.  Whether you use tobacco, alcohol, marijuana, or street drugs.  Any history of [employer].  Any history of depression or anxiety. What are the risks? Generally, this is a safe procedure. However, problems may occur, including:  Allergic reaction to anesthetics.  Lung and heart problems.  Inhaling food or liquids from your stomach into your lungs (aspiration).  Injury to nerves.  Waking up during your procedure and being unable to move (rare).  Extreme agitation or a state of mental confusion (delirium) when you wake up from the anesthetic.  Air in the bloodstream, which can lead to stroke. These problems are more likely to develop if you are having a major surgery or if you have an advanced medical condition. You can prevent some of these complications by answering all of your health care provider's questions thoroughly and by  following all pre-procedure instructions. General anesthesia can cause side effects, including:  Nausea or vomiting  A sore throat from the breathing tube.  Feeling cold or shivery.  Feeling tired, washed out, or achy.  Sleepiness or drowsiness.  Confusion or agitation. What happens before the procedure? Staying hydrated  Follow instructions from your health care provider about hydration, which may include:  Up to 2 hours before the procedure - you may continue to drink clear liquids, such as water, clear fruit juice, black coffee, and plain tea. Eating and drinking restrictions  Follow instructions from your health care provider about eating and drinking, which may include:  8 hours before the procedure - stop eating heavy meals or foods such as meat, fried foods, or fatty foods.  6 hours before the procedure - stop eating light meals or foods, such as toast or cereal.  6 hours before the procedure - stop drinking milk or drinks that  contain milk.  2 hours before the procedure - stop drinking clear liquids. Medicines   Ask your health care provider about:  Changing or stopping your regular medicines. This is especially important if you are taking diabetes medicines or blood thinners.  Taking medicines such as aspirin and ibuprofen. These medicines can thin your blood. Do not take these medicines before your procedure if your health care provider instructs you not to.  Taking new dietary supplements or medicines. Do not take these during the week before your procedure unless your health care provider approves them.  If you are told to take a medicine or to continue taking a medicine on the day of the procedure, take the medicine with sips of water. General instructions    Ask if you will be going home the same day, the following day, or after a longer hospital stay.  Plan to have someone take you home.  Plan to have someone stay with you for the first 24 hours after you leave the hospital or clinic.  For 3-6 weeks before the procedure, try not to use any tobacco products, such as cigarettes, chewing tobacco, and e-cigarettes.  You may brush your teeth on the morning of the procedure, but make sure to spit out the toothpaste. What happens during the procedure?  You will be given anesthetics through a mask and through an IV tube in one of your veins.  You may receive medicine to help you relax (sedative).  As soon as you are asleep, a breathing tube may be used to help you breathe.  An anesthesia specialist will stay with you throughout the procedure. He or she will help keep you comfortable and safe by continuing to give you medicines and adjusting the amount of medicine that you get. He or she will also watch your blood pressure, pulse, and oxygen levels to make sure that the anesthetics do not cause any problems.  If a breathing tube was used to help you breathe, it will be removed before you wake up. The  procedure may vary among health care providers and hospitals. What happens after the procedure?  You will wake up, often slowly, after the procedure is complete, usually in a recovery area.  Your blood pressure, heart rate, breathing rate, and blood oxygen level will be monitored until the medicines you were given have worn off.  You may be given medicine to help you calm down if you feel anxious or agitated.  If you will be going home the same day, your health care provider may check  to make sure you can stand, drink, and urinate.  Your health care providers will treat your pain and side effects before you go home.  Do not drive for 24 hours if you received a sedative.  You may:  Feel nauseous and vomit.  Have a sore throat.  Have mental slowness.  Feel cold or shivery.  Feel sleepy.  Feel tired.  Feel sore or achy, even in parts of your body where you did not have surgery. This information is not intended to replace advice given to you by your health care provider. Make sure you discuss any questions you have with your health care provider. Document Released: 09/30/2007 Document Revised: 12/04/2015 Document Reviewed: 06/07/2015 Elsevier Interactive Patient Education  2017 [employer] General Anesthesia, Adult, Care After These instructions provide you with information about caring for yourself after your procedure. Your health care provider may also give you more specific instructions. Your treatment has been planned according to current medical practices, but problems sometimes occur. Call your health care provider if you have any problems or questions after your procedure. What can I expect after the procedure? After the procedure, it is common to have:  Vomiting.  A sore throat.  Mental slowness. It is common to feel:  Nauseous.  Cold or shivery.  Sleepy.  Tired.  Sore or achy, even in parts of your body where you did not have surgery. Follow these  instructions at home: For at least 24 hours after the procedure:   Do not:  Participate in activities where you could fall or become injured.  Drive.  Use heavy machinery.  Drink alcohol.  Take sleeping pills or medicines that cause drowsiness.  Make important decisions or sign legal documents.  Take care of children on your own.  Rest. Eating and drinking   If you vomit, drink water, juice, or soup when you can drink without vomiting.  Drink enough fluid to keep your urine clear or pale yellow.  Make sure you have little or no nausea before eating solid foods.  Follow the diet recommended by your health care provider. General instructions   Have a responsible adult stay with you until you are awake and alert.  Return to your normal activities as told by your health care provider. Ask your health care provider what activities are safe for you.  Take over-the-counter and prescription medicines only as told by your health care provider.  If you smoke, do not smoke without supervision.  Keep all follow-up visits as told by your health care provider. This is important. Contact a health care provider if:  You continue to have nausea or vomiting at home, and medicines are not helpful.  You cannot drink fluids or start eating again.  You cannot urinate after 8-12 hours.  You develop a skin rash.  You have fever.  You have increasing redness at the site of your procedure. Get help right away if:  You have difficulty breathing.  You have chest pain.  You have unexpected bleeding.  You feel that you are having a life-threatening or urgent problem. This information is not intended to replace advice given to you by your health care provider. Make sure you discuss any questions you have with your health care provider. Document Released: 09/29/2000 Document Revised: 11/26/2015 Document Reviewed: 06/07/2015 Elsevier Interactive Patient Education  2017 [employer].

## 2016-10-23 NOTE — Patient Instructions (Signed)
Surgical Procedures for Hemorrhoids, Care After °Refer to this sheet in the next few weeks. These instructions provide you with information about caring for yourself after your procedure. Your health care provider may also give you more specific instructions. Your treatment has been planned according to current medical practices, but problems sometimes occur. Call your health care provider if you have any problems or questions after your procedure. °What can I expect after the procedure? °After the procedure, it is common to have: °· Rectal pain. °· Pain when you are having a bowel movement. °· Slight rectal bleeding. °Follow these instructions at home: °Medicines  °· Take over-the-counter and prescription medicines only as told by your health care provider. °· Do not drive or operate heavy machinery while taking prescription pain medicine. °· Use a stool softener or a bulk laxative as told by your health care provider. °Activity  °· Rest at home. Return to your normal activities as told by your health care provider. °· Do not lift anything that is heavier than 10 lb (4.5 kg). °· Do not sit for long periods of time. Take a walk every day or as told by your health care provider. °· Do not strain to have a bowel movement. Do not spend a long time sitting on the toilet. °Eating and drinking  °· Eat foods that contain fiber, such as whole grains, beans, nuts, fruits, and vegetables. °· Drink enough fluid to keep your urine clear or pale yellow. °General instructions  °· Sit in a warm bath 2-3 times per day to relieve soreness or itching. °· Keep all follow-up visits as told by your health care provider. This is important. °Contact a health care provider if: °· Your pain medicine is not helping. °· You have a fever or chills. °· You become constipated. °· You have trouble passing urine. °Get help right away if: °· You have very bad rectal pain. °· You have heavy bleeding from your rectum. °This information is not  intended to replace advice given to you by your health care provider. Make sure you discuss any questions you have with your health care provider. °Document Released: 09/13/2003 Document Revised: 11/29/2015 Document Reviewed: 09/18/2014 °Elsevier Interactive Patient Education © 2017 Elsevier Inc. ° °

## 2016-10-23 NOTE — Progress Notes (Signed)
Subjective:     Pamela Glover  Here for follow-up of thrombosed hemorrhoid. The thrombosis is still present and she is still having mild pain, though she is improved from the last time I saw her. Her bleeding has decreased. Objective:    BP 100/63   Pulse 89   Temp 98 F (36.7 C)   Resp 18   Ht 5' 4"  (1.626 m)   Wt 97 lb (44 kg)   LMP 10/04/2016   BMI 16.65 kg/m   General:  alert, cooperative and no distress  Rectal examination reveals a residual thrombosed hemorrhoid which is smaller than when I last saw HER-2 days ago.     Assessment:    Thrombosed external hemorrhoid    Plan:   Patient scheduled for a hemorrhoidectomy on 10/27/2016. The risks and benefits of the procedure were fully explained to the patient, who gave informed consent.

## 2016-10-24 LAB — PREGNANCY, URINE: Preg Test, Ur: NEGATIVE

## 2016-10-27 ENCOUNTER — Ambulatory Visit (HOSPITAL_COMMUNITY): Payer: Medicaid Other | Admitting: Anesthesiology

## 2016-10-27 ENCOUNTER — Ambulatory Visit (HOSPITAL_COMMUNITY)
Admission: RE | Admit: 2016-10-27 | Discharge: 2016-10-27 | Disposition: A | Discharge status: Home / Self Care | Payer: Medicaid Other | Source: Ambulatory Visit | Attending: General Surgery | Admitting: General Surgery

## 2016-10-27 ENCOUNTER — Encounter (HOSPITAL_COMMUNITY)
Admission: RE | Disposition: A | Discharge status: Home / Self Care | Payer: Self-pay | Source: Ambulatory Visit | Attending: General Surgery

## 2016-10-27 ENCOUNTER — Encounter (HOSPITAL_COMMUNITY): Payer: Self-pay | Admitting: Anesthesiology

## 2016-10-27 DIAGNOSIS — K645 Perianal venous thrombosis: Secondary | ICD-10-CM | POA: Diagnosis present

## 2016-10-27 DIAGNOSIS — F17210 Nicotine dependence, cigarettes, uncomplicated: Secondary | ICD-10-CM | POA: Insufficient documentation

## 2016-10-27 DIAGNOSIS — Z79899 Other long term (current) drug therapy: Secondary | ICD-10-CM | POA: Diagnosis not present

## 2016-10-27 DIAGNOSIS — Z791 Long term (current) use of non-steroidal anti-inflammatories (NSAID): Secondary | ICD-10-CM | POA: Insufficient documentation

## 2016-10-27 HISTORY — PX: HEMORRHOID SURGERY: SHX153

## 2016-10-27 SURGERY — HEMORRHOIDECTOMY
Anesthesia: General | Site: Rectum

## 2016-10-27 MED ORDER — LIDOCAINE VISCOUS 2 % MT SOLN
OROMUCOSAL | Status: DC | PRN
  Administered 2016-10-27: 20 mL

## 2016-10-27 MED ORDER — LIDOCAINE VISCOUS 2 % MT SOLN
OROMUCOSAL | Status: AC
  Filled 2016-10-27: qty 15

## 2016-10-27 MED ORDER — SODIUM CHLORIDE 0.9 % IR SOLN
Status: DC | PRN
  Administered 2016-10-27: 1000 mL

## 2016-10-27 MED ORDER — BUPIVACAINE HCL (PF) 0.5 % IJ SOLN
INTRAMUSCULAR | Status: DC | PRN
  Administered 2016-10-27: 9 mL

## 2016-10-27 MED ORDER — HYDROMORPHONE HCL 1 MG/ML IJ SOLN
0.2500 mg | INTRAMUSCULAR | Status: DC | PRN
  Administered 2016-10-27 (×4): 0.5 mg via INTRAVENOUS
  Filled 2016-10-27 (×2): qty 1

## 2016-10-27 MED ORDER — LACTATED RINGERS IV SOLN
INTRAVENOUS | Status: DC
  Administered 2016-10-27: 09:00:00 via INTRAVENOUS

## 2016-10-27 MED ORDER — KETOROLAC TROMETHAMINE 30 MG/ML IJ SOLN
INTRAMUSCULAR | Status: AC
  Filled 2016-10-27: qty 1

## 2016-10-27 MED ORDER — PROPOFOL 10 MG/ML IV BOLUS
INTRAVENOUS | Status: AC
  Filled 2016-10-27: qty 20

## 2016-10-27 MED ORDER — LIDOCAINE HCL (CARDIAC) 10 MG/ML IV SOLN
INTRAVENOUS | Status: DC | PRN
  Administered 2016-10-27: 40 mg via INTRAVENOUS

## 2016-10-27 MED ORDER — KETOROLAC TROMETHAMINE 30 MG/ML IJ SOLN
30.0000 mg | Freq: Once | INTRAMUSCULAR | Status: AC
  Administered 2016-10-27: 30 mg via INTRAVENOUS

## 2016-10-27 MED ORDER — EPHEDRINE SULFATE 50 MG/ML IJ SOLN
INTRAMUSCULAR | Status: AC
  Filled 2016-10-27: qty 1

## 2016-10-27 MED ORDER — ONDANSETRON HCL 4 MG/2ML IJ SOLN
4.0000 mg | Freq: Once | INTRAMUSCULAR | Status: AC
  Administered 2016-10-27: 4 mg via INTRAVENOUS
  Filled 2016-10-27: qty 2

## 2016-10-27 MED ORDER — OXYCODONE-ACETAMINOPHEN 7.5-325 MG PO TABS: 1.0000 | ORAL_TABLET | ORAL | 0 refills | Status: DC | PRN

## 2016-10-27 MED ORDER — MIDAZOLAM HCL 2 MG/2ML IJ SOLN
1.0000 mg | INTRAMUSCULAR | Status: AC
  Administered 2016-10-27: 2 mg via INTRAVENOUS
  Filled 2016-10-27: qty 2

## 2016-10-27 MED ORDER — BUPIVACAINE HCL (PF) 0.5 % IJ SOLN
INTRAMUSCULAR | Status: AC
  Filled 2016-10-27: qty 30

## 2016-10-27 MED ORDER — BUPIVACAINE-EPINEPHRINE (PF) 0.5% -1:200000 IJ SOLN
INTRAMUSCULAR | Status: AC
  Filled 2016-10-27: qty 30

## 2016-10-27 MED ORDER — FENTANYL CITRATE (PF) 100 MCG/2ML IJ SOLN
INTRAMUSCULAR | Status: AC
  Filled 2016-10-27: qty 2

## 2016-10-27 MED ORDER — FENTANYL CITRATE (PF) 100 MCG/2ML IJ SOLN
INTRAMUSCULAR | Status: DC | PRN
  Administered 2016-10-27 (×2): 25 ug via INTRAVENOUS
  Administered 2016-10-27: 50 ug via INTRAVENOUS

## 2016-10-27 MED ORDER — SODIUM CHLORIDE 0.9 % IJ SOLN
INTRAMUSCULAR | Status: AC
  Filled 2016-10-27: qty 10

## 2016-10-27 MED ORDER — PROPOFOL 10 MG/ML IV BOLUS
INTRAVENOUS | Status: DC | PRN
  Administered 2016-10-27: 120 mg via INTRAVENOUS
  Administered 2016-10-27: 50 mg via INTRAVENOUS

## 2016-10-27 MED ORDER — METRONIDAZOLE IN NACL 5-0.79 MG/ML-% IV SOLN
500.0000 mg | INTRAVENOUS | Status: AC
  Administered 2016-10-27: 500 mg via INTRAVENOUS
  Filled 2016-10-27: qty 100

## 2016-10-27 MED ORDER — CHLORHEXIDINE GLUCONATE CLOTH 2 % EX PADS: 6.0000 | MEDICATED_PAD | Freq: Once | CUTANEOUS | Status: DC

## 2016-10-27 MED ORDER — LIDOCAINE HCL (PF) 1 % IJ SOLN
INTRAMUSCULAR | Status: AC
  Filled 2016-10-27: qty 5

## 2016-10-27 SURGICAL SUPPLY — 32 items
BAG HAMPER (MISCELLANEOUS) ×3 IMPLANT
CLOTH BEACON ORANGE TIMEOUT ST (SAFETY) ×3 IMPLANT
COVER LIGHT HANDLE STERIS (MISCELLANEOUS) ×6 IMPLANT
DECANTER SPIKE VIAL GLASS SM (MISCELLANEOUS) ×3 IMPLANT
DRAPE HALF SHEET 40X57 (DRAPES) ×3 IMPLANT
DRAPE PROXIMA HALF (DRAPES) ×3 IMPLANT
ELECT REM PT RETURN 9FT ADLT (ELECTROSURGICAL) ×3
ELECTRODE REM PT RTRN 9FT ADLT (ELECTROSURGICAL) ×1 IMPLANT
FORMALIN 10 PREFIL 120ML (MISCELLANEOUS) ×3 IMPLANT
GAUZE SPONGE 4X4 12PLY STRL (GAUZE/BANDAGES/DRESSINGS) ×3 IMPLANT
GLOVE BIOGEL PI IND STRL 6.5 (GLOVE) ×1 IMPLANT
GLOVE BIOGEL PI IND STRL 7.0 (GLOVE) ×1 IMPLANT
GLOVE BIOGEL PI INDICATOR 6.5 (GLOVE) ×2
GLOVE BIOGEL PI INDICATOR 7.0 (GLOVE) ×2
GLOVE SURG SS PI 7.5 STRL IVOR (GLOVE) ×6 IMPLANT
GOWN STRL REUS W/ TWL XL LVL3 (GOWN DISPOSABLE) ×1 IMPLANT
GOWN STRL REUS W/TWL LRG LVL3 (GOWN DISPOSABLE) ×3 IMPLANT
GOWN STRL REUS W/TWL XL LVL3 (GOWN DISPOSABLE) ×2
HEMOSTAT SURGICEL 4X8 (HEMOSTASIS) ×3 IMPLANT
KIT ROOM TURNOVER AP CYSTO (KITS) ×3 IMPLANT
LIGASURE IMPACT 36 18CM CVD LR (INSTRUMENTS) ×3 IMPLANT
MANIFOLD NEPTUNE II (INSTRUMENTS) ×3 IMPLANT
NEEDLE HYPO 25X1 1.5 SAFETY (NEEDLE) ×3 IMPLANT
NS IRRIG 1000ML POUR BTL (IV SOLUTION) ×3 IMPLANT
PACK PERI GYN (CUSTOM PROCEDURE TRAY) ×3 IMPLANT
PAD ARMBOARD 7.5X6 YLW CONV (MISCELLANEOUS) ×3 IMPLANT
SET BASIN LINEN APH (SET/KITS/TRAYS/PACK) ×3 IMPLANT
SPONGE COVER 4X4 NONSTERILE (GAUZE/BANDAGES/DRESSINGS) ×3 IMPLANT
SURGILUBE 3G PEEL PACK STRL (MISCELLANEOUS) ×3 IMPLANT
SUT SILK 0 FSL (SUTURE) ×3 IMPLANT
SUT VIC AB 2-0 CT2 27 (SUTURE) IMPLANT
SYR CONTROL 10ML LL (SYRINGE) ×3 IMPLANT

## 2016-10-27 NOTE — Discharge Instructions (Signed)
Surgical Procedures for Hemorrhoids, Care After Refer to this sheet in the next few weeks. These instructions provide you with information about caring for yourself after your procedure. Your health care provider may also give you more specific instructions. Your treatment has been planned according to current medical practices, but problems sometimes occur. Call your health care provider if you have any problems or questions after your procedure. What can I expect after the procedure? After the procedure, it is common to have:  Rectal pain.  Pain when you are having a bowel movement.  Slight rectal bleeding. Follow these instructions at home: Medicines   Take over-the-counter and prescription medicines only as told by your health care provider.  Do not drive or operate heavy machinery while taking prescription pain medicine.  Use a stool softener or a bulk laxative as told by your health care provider. Activity   Rest at home. Return to your normal activities as told by your health care provider.  Do not lift anything that is heavier than 10 lb (4.5 kg).  Do not sit for long periods of time. Take a walk every day or as told by your health care provider.  Do not strain to have a bowel movement. Do not spend a long time sitting on the toilet. Eating and drinking   Eat foods that contain fiber, such as whole grains, beans, nuts, fruits, and vegetables.  Drink enough fluid to keep your urine clear or pale yellow. General instructions   Sit in a warm bath 2-3 times per day to relieve soreness or itching.  Keep all follow-up visits as told by your health care provider. This is important. Contact a health care provider if:  Your pain medicine is not helping.  You have a fever or chills.  You become constipated.  You have trouble passing urine. Get help right away if:  You have very bad rectal pain.  You have heavy bleeding from your rectum. This information is not  intended to replace advice given to you by your health care provider. Make sure you discuss any questions you have with your health care provider. Document Released: 09/13/2003 Document Revised: 11/29/2015 Document Reviewed: 09/18/2014 Elsevier Interactive Patient Education  2017 [employer]. How to Take a [employer] A sitz bath is a warm water bath that is taken while you are sitting down. The water should only come up to your hips and should cover your buttocks. Your health care provider may recommend a sitz bath to help you:  Clean the lower part of your body, including your genital area.  With itching.  With pain.  With sore muscles or muscles that tighten or spasm. How to take a sitz bath Take 3-4 sitz baths per day or as told by your health care provider. 1. Partially fill a bathtub with warm water. You will only need the water to be deep enough to cover your hips and buttocks when you are sitting in it. 2. If your health care provider told you to put medicine in the water, follow the directions exactly. 3. Sit in the water and open the tub drain a little. 4. Turn on the warm water again to keep the tub at the correct level. Keep the water running constantly. 5. Soak in the water for 15-20 minutes or as told by your health care provider. 6. After the sitz bath, pat the affected area dry first. Do not rub it. 7. Be careful when you stand up after the sitz bath  because you may feel dizzy. Contact a health care provider if:  Your symptoms get worse. Do not continue with sitz baths if your symptoms get worse.  You have new symptoms. Do not continue with sitz baths until you talk with your health care provider. This information is not intended to replace advice given to you by your health care provider. Make sure you discuss any questions you have with your health care provider. Document Released: 03/15/2004 Document Revised: 11/21/2015 Document Reviewed: 06/21/2014 Elsevier  Interactive Patient Education  2017 [employer].

## 2016-10-27 NOTE — Op Note (Signed)
Patient:  Pamela Glover  DOB:  18-Jan-1991  MRN:  [id number]   Preop Diagnosis:  Thrombosed external hemorrhoid  Postop Diagnosis:  Same  Procedure:  Hemorrhoidectomy  Surgeon:  Franky Macho, M.D.  Anes:  Gen.  Indications:  Patient is a 26 year old white female with thrombosed external hemorrhoid. Evacuation of the clot was performed in the office, but the patient's pain and swelling persisted. The patient comes to the operating room for a simple hemorrhoidectomy. The risks and benefits of the procedure including bleeding, infection, and recurrence of the hemorrhoid were fully explained to the patient, who gave informed consent.  Procedure note:  The patient was placed in the lithotomy position after general anesthesia was administered. The perineum was prepped and draped using usual sterile technique with Betadine. Surgical site confirmation was performed.  At the 7:00 position, a thrombosed external hemorrhoid was noted with exposed clot. The clot was evacuated and the hemorrhoid was removed using the LigaSure without difficulty in a columnlike fashion. It was disposed of. No other hemorrhoidal tissue was noted. Care was taken to avoid the external sphincter mechanism. No bleeding was noted at the end of the procedure. 0.5% Sensorcaine was instilled into the surrounding wound. Surgicel and viscous Xylocaine rectal packing was then placed.  All tape and needle counts were correct at the end of the procedure. The patient was awakened and transferred to PACU in stable condition.  Complications:  None  EBL:  None  Specimen:  None

## 2016-10-27 NOTE — Transfer of Care (Signed)
Immediate Anesthesia Transfer of Care Note  Patient: Pamela Glover  Procedure(s) Performed: Procedure(s): HEMORRHOIDECTOMY (N/A)  Patient Location: PACU  Anesthesia Type:General  Level of Consciousness: awake and alert   Airway & Oxygen Therapy: Patient Spontanous Breathing and Patient connected to face mask oxygen  Post-op Assessment: Report given to RN  Post vital signs: Reviewed and stable  Last Vitals:  Vitals:   10/27/16 0843  BP: 108/68  Pulse: 85  Resp: 16  Temp: 36.9 C    Last Pain:  Vitals:   10/27/16 0843  TempSrc: Oral  PainSc: 3       Patients Stated Pain Goal: 8 (10/27/16 0843)  Complications: No apparent anesthesia complications

## 2016-10-27 NOTE — Anesthesia Procedure Notes (Signed)
Procedure Name: LMA Insertion Date/Time: 10/27/2016 9:13 AM Performed by: Glynn Octave E Pre-anesthesia Checklist: Patient identified, Patient being monitored, Emergency Drugs available, Timeout performed and Suction available Patient Re-evaluated:Patient Re-evaluated prior to inductionOxygen Delivery Method: Circle System Utilized Preoxygenation: Pre-oxygenation with 100% oxygen Intubation Type: IV induction Ventilation: Mask ventilation without difficulty LMA: LMA inserted LMA Size: 3.0 Number of attempts: 1 Placement Confirmation: positive ETCO2 and breath sounds checked- equal and bilateral

## 2016-10-27 NOTE — Anesthesia Preprocedure Evaluation (Signed)
Anesthesia Evaluation  Patient identified by MRN, date of birth, ID band Patient awake    Reviewed: Allergy & Precautions, NPO status , Patient's Chart, lab work & pertinent test results  Airway Mallampati: II  TM Distance: >3 FB     Dental  (+) Teeth Intact   Pulmonary Current Smoker,    breath sounds clear to auscultation       Cardiovascular negative cardio ROS   Rhythm:Regular Rate:Normal     Neuro/Psych PSYCHIATRIC DISORDERS Anxiety Depression    GI/Hepatic negative GI ROS,   Endo/Other    Renal/GU      Musculoskeletal   Abdominal   Peds  Hematology   Anesthesia Other Findings   Reproductive/Obstetrics                             Anesthesia Physical Anesthesia Plan  ASA: II  Anesthesia Plan: General   Post-op Pain Management:    Induction: Intravenous  Airway Management Planned: LMA  Additional Equipment:   Intra-op Plan:   Post-operative Plan: Extubation in OR  Informed Consent: I have reviewed the patients History and Physical, chart, labs and discussed the procedure including the risks, benefits and alternatives for the proposed anesthesia with the patient or authorized representative who has indicated his/her understanding and acceptance.     Plan Discussed with:   Anesthesia Plan Comments:         Anesthesia Quick Evaluation

## 2016-10-27 NOTE — Interval H&P Note (Signed)
History and Physical Interval Note:  10/27/2016 8:22 AM  Pamela Glover  has presented today for surgery, with the diagnosis of thrombosed hemorrhoids  The various methods of treatment have been discussed with the patient and family. After consideration of risks, benefits and other options for treatment, the patient has consented to  Procedure(s): HEMORRHOIDECTOMY (N/A) as a surgical intervention .  The patient's history has been reviewed, patient examined, no change in status, stable for surgery.  I have reviewed the patient's chart and labs.  Questions were answered to the patient's satisfaction.     Franky Macho

## 2016-10-27 NOTE — Anesthesia Postprocedure Evaluation (Signed)
Anesthesia Post Note  Patient: Pamela Glover  Procedure(s) Performed: Procedure(s) (LRB): HEMORRHOIDECTOMY (N/A)  Patient location during evaluation: PACU Anesthesia Type: General Level of consciousness: awake and alert and oriented Pain management: pain level controlled Vital Signs Assessment: post-procedure vital signs reviewed and stable Respiratory status: spontaneous breathing Cardiovascular status: blood pressure returned to baseline and stable Postop Assessment: no signs of nausea or vomiting Anesthetic complications: no     Last Vitals:  Vitals:   10/27/16 0946 10/27/16 1000  BP:  115/87  Pulse:  71  Resp: (P) 18 10  Temp: (P) 36.8 C (P) 36.8 C    Last Pain:  Vitals:   10/27/16 1000  TempSrc:   PainSc: (P) 10-Worst pain ever                 Cashe Gatt

## 2016-10-29 ENCOUNTER — Encounter (HOSPITAL_COMMUNITY): Payer: Self-pay | Admitting: General Surgery

## 2016-11-04 ENCOUNTER — Ambulatory Visit: Payer: Medicaid Other | Admitting: General Surgery

## 2016-11-05 ENCOUNTER — Encounter: Payer: Self-pay | Admitting: General Surgery

## 2016-11-05 ENCOUNTER — Ambulatory Visit (INDEPENDENT_AMBULATORY_CARE_PROVIDER_SITE_OTHER): Payer: Self-pay | Admitting: General Surgery

## 2016-11-05 VITALS — BP 104/68 | HR 89 | Temp 98.0°F | Resp 18 | Ht 64.0 in | Wt 99.0 lb

## 2016-11-05 DIAGNOSIS — Z09 Encounter for follow-up examination after completed treatment for conditions other than malignant neoplasm: Secondary | ICD-10-CM

## 2016-11-05 MED ORDER — OXYCODONE-ACETAMINOPHEN 7.5-325 MG PO TABS: 1.0000 | ORAL_TABLET | Freq: Four times a day (QID) | ORAL | 0 refills | Status: AC | PRN

## 2016-11-05 NOTE — Progress Notes (Signed)
Subjective:     Pamela Glover  Status post excision of thrombosed external hemorrhoid. Patient is still having some pain, but it is significantly improved from preop. No active bleeding noted. Objective:    BP 104/68   Pulse 89   Temp 98 F (36.7 C)   Resp 18   Ht  (1.626 m)   Wt 99 lb (44.9 kg)   BMI 16.99 kg/m   General:  alert, cooperative and no distress  Rectal examination reveals a well-healing anus. No active bleeding noted.     Assessment:    Doing well postoperatively.    Plan:   Percocet for pain. Continue soaks intolerable and baby wipes. May return to work in 2 weeks. Follow up here when necessary.
